# Patient Record
Sex: Female | Born: 1980 | Race: White | Hispanic: No | Marital: Married | Smoking: Current every day smoker
Health system: Southern US, Community
[De-identification: ages and names within clinical notes are randomized; demographics above are authoritative.]

## PROBLEM LIST (undated history)

## (undated) DIAGNOSIS — M1990 Unspecified osteoarthritis, unspecified site: Secondary | ICD-10-CM

## (undated) DIAGNOSIS — E282 Polycystic ovarian syndrome: Secondary | ICD-10-CM

## (undated) DIAGNOSIS — I10 Essential (primary) hypertension: Secondary | ICD-10-CM

## (undated) DIAGNOSIS — E119 Type 2 diabetes mellitus without complications: Secondary | ICD-10-CM

## (undated) DIAGNOSIS — K219 Gastro-esophageal reflux disease without esophagitis: Secondary | ICD-10-CM

## (undated) HISTORY — DX: Type 2 diabetes mellitus without complications: E11.9

## (undated) HISTORY — PX: WISDOM TOOTH EXTRACTION: SHX21

## (undated) HISTORY — DX: Gastro-esophageal reflux disease without esophagitis: K21.9

## (undated) HISTORY — DX: Essential (primary) hypertension: I10

---

## 2005-02-12 ENCOUNTER — Emergency Department (HOSPITAL_COMMUNITY): Admission: EM | Admit: 2005-02-12 | Discharge: 2005-02-12 | Payer: Self-pay | Admitting: Emergency Medicine

## 2016-09-29 ENCOUNTER — Ambulatory Visit: Payer: Medicaid Other

## 2016-09-29 ENCOUNTER — Encounter: Payer: Self-pay | Admitting: Orthopaedic Surgery

## 2016-09-29 ENCOUNTER — Ambulatory Visit (INDEPENDENT_AMBULATORY_CARE_PROVIDER_SITE_OTHER): Payer: Medicaid Other | Admitting: Orthopaedic Surgery

## 2016-09-29 VITALS — BP 149/89 | HR 83 | Temp 98.2°F | Ht 63.0 in | Wt 197.0 lb

## 2016-09-29 DIAGNOSIS — M25551 Pain in right hip: Secondary | ICD-10-CM | POA: Diagnosis not present

## 2016-09-29 DIAGNOSIS — G8929 Other chronic pain: Secondary | ICD-10-CM | POA: Diagnosis not present

## 2016-09-29 DIAGNOSIS — M5441 Lumbago with sciatica, right side: Secondary | ICD-10-CM | POA: Diagnosis not present
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
## 2016-09-29 NOTE — Addendum Note (Signed)
Addended by: Lenis NoonSACKFIELD, Riniyah Speich L on: 09/29/2016 09:30 AM   Modules accepted: Orders

## 2016-09-29 NOTE — Patient Instructions (Signed)
If not pregnant, call and we can get x-rays of the lumbar spine and hip.

## 2016-10-13 ENCOUNTER — Ambulatory Visit: Payer: Self-pay | Admitting: Orthopaedic Surgery

## 2016-11-18 ENCOUNTER — Encounter: Payer: Self-pay | Admitting: Orthopaedic Surgery

## 2016-11-18 ENCOUNTER — Ambulatory Visit (INDEPENDENT_AMBULATORY_CARE_PROVIDER_SITE_OTHER): Payer: Medicaid Other

## 2016-11-18 ENCOUNTER — Ambulatory Visit (INDEPENDENT_AMBULATORY_CARE_PROVIDER_SITE_OTHER): Payer: Medicaid Other | Admitting: Orthopaedic Surgery

## 2016-11-18 VITALS — BP 147/90 | HR 89 | Temp 97.4°F | Ht 63.0 in | Wt 200.0 lb

## 2016-11-18 DIAGNOSIS — F17210 Nicotine dependence, cigarettes, uncomplicated: Secondary | ICD-10-CM

## 2016-11-18 DIAGNOSIS — G8929 Other chronic pain: Secondary | ICD-10-CM

## 2016-11-18 DIAGNOSIS — M5441 Lumbago with sciatica, right side: Secondary | ICD-10-CM

## 2016-11-18 NOTE — Patient Instructions (Signed)
Steps to Quit Smoking Smoking tobacco can be bad for your health. It can also affect almost every organ in your body. Smoking puts you and people around you at risk for many serious Jenaye Rickert-lasting (chronic) diseases. Quitting smoking is hard, but it is one of the best things that you can do for your health. It is never too late to quit. What are the benefits of quitting smoking? When you quit smoking, you lower your risk for getting serious diseases and conditions. They can include:  Lung cancer or lung disease.  Heart disease.  Stroke.  Heart attack.  Not being able to have children (infertility).  Weak bones (osteoporosis) and broken bones (fractures).  If you have coughing, wheezing, and shortness of breath, those symptoms may get better when you quit. You may also get sick less often. If you are pregnant, quitting smoking can help to lower your chances of having a baby of low birth weight. What can I do to help me quit smoking? Talk with your doctor about what can help you quit smoking. Some things you can do (strategies) include:  Quitting smoking totally, instead of slowly cutting back how much you smoke over a period of time.  Going to in-person counseling. You are more likely to quit if you go to many counseling sessions.  Using resources and support systems, such as: ? Online chats with a counselor. ? Phone quitlines. ? Printed self-help materials. ? Support groups or group counseling. ? Text messaging programs. ? Mobile phone apps or applications.  Taking medicines. Some of these medicines may have nicotine in them. If you are pregnant or breastfeeding, do not take any medicines to quit smoking unless your doctor says it is okay. Talk with your doctor about counseling or other things that can help you.  Talk with your doctor about using more than one strategy at the same time, such as taking medicines while you are also going to in-person counseling. This can help make  quitting easier. What things can I do to make it easier to quit? Quitting smoking might feel very hard at first, but there is a lot that you can do to make it easier. Take these steps:  Talk to your family and friends. Ask them to support and encourage you.  Call phone quitlines, reach out to support groups, or work with a counselor.  Ask people who smoke to not smoke around you.  Avoid places that make you want (trigger) to smoke, such as: ? Bars. ? Parties. ? Smoke-break areas at work.  Spend time with people who do not smoke.  Lower the stress in your life. Stress can make you want to smoke. Try these things to help your stress: ? Getting regular exercise. ? Deep-breathing exercises. ? Yoga. ? Meditating. ? Doing a body scan. To do this, close your eyes, focus on one area of your body at a time from head to toe, and notice which parts of your body are tense. Try to relax the muscles in those areas.  Download or buy apps on your mobile phone or tablet that can help you stick to your quit plan. There are many free apps, such as QuitGuide from the CDC (Centers for Disease Control and Prevention). You can find more support from smokefree.gov and other websites.  This information is not intended to replace advice given to you by your health care provider. Make sure you discuss any questions you have with your health care provider. Document Released: 02/20/2009 Document   Revised: 12/23/2015 Document Reviewed: 09/10/2014 Elsevier Interactive Patient Education  2018 Elsevier Inc.  

## 2016-11-18 NOTE — Progress Notes (Signed)
Patient WU:[id number]:Jennifer Cordova, female DOB:[birth year], 36 y.o. GNF:[id number]  Chief Complaint  Patient presents with  . Follow-up    Low back and right hip pain    HPI  Jennifer DuffStephanie Cordova is a 36 y.o. female who has lower back pain.  She has good and bad days.  She has right sided paresthesias at time. She has no new trauma.  She did not get x-rays last time because she thought she could be pregnant but was not.  She has no weakness or bowel or bladder problems.  I want her to go to PT for the one time visit Medicaid allows and learn exercises to do. HPI  Body mass index is 35.43 kg/m.  ROS  Review of Systems  HENT: Negative for congestion.   Respiratory: Negative for cough and shortness of breath.   Cardiovascular: Negative for chest pain and leg swelling.  Endocrine: Positive for cold intolerance.  Musculoskeletal: Positive for arthralgias, back pain and gait problem.  Allergic/Immunologic: Positive for environmental allergies.  Psychiatric/Behavioral: The patient is nervous/anxious.     Past Medical History:  Diagnosis Date  . Diabetes mellitus without complication (HCC)   . GERD (gastroesophageal reflux disease)   . Hypertension     Past Surgical History:  Procedure Laterality Date  . WISDOM TOOTH EXTRACTION      Family History  Problem Relation Age of Onset  . Epilepsy Mother   . Stroke Mother   . Hypertension Mother   . COPD Sister   . Bipolar disorder Sister     Social History Social History  Substance Use Topics  . Smoking status: Never Smoker  . Smokeless tobacco: Never Used  . Alcohol use No    Allergies  Allergen Reactions  . Penicillins     Current Outpatient Prescriptions  Medication Sig Dispense Refill  . acetaminophen-codeine (TYLENOL #3) 300-30 MG tablet Take by mouth every 6 (six) hours as needed for moderate pain.    . AZO-CRANBERRY PO Take by mouth as needed.    . DiphenhydrAMINE HCl, Sleep, (SLEEP AID) 50 MG CAPS Take by mouth at  bedtime as needed.    . famotidine (PEPCID) 20 MG tablet Take 20 mg by mouth 2 (two) times daily.    Chilton Si. Green Tea, Camillia sinensis, (GREEN TEA PO) Take by mouth.    . hydrochlorothiazide (HYDRODIURIL) 25 MG tablet Take 25 mg by mouth daily.    Marland Kitchen. ibuprofen (ADVIL,MOTRIN) 800 MG tablet Take 800 mg by mouth 3 (three) times daily as needed.    Marland Kitchen. Lysine 1000 MG TABS Take by mouth.    . Omega-3 Fatty Acids (FISH OIL) 1000 MG CAPS Take by mouth.    Marland Kitchen. omeprazole (PRILOSEC) 20 MG capsule Take 20 mg by mouth daily.    . Prenatal Vit-Fe Fumarate-FA (PRENATAL COMPLETE PO) Take by mouth.    . Probiotic Product (PROBIOTIC PO) Take by mouth.    . simvastatin (ZOCOR) 20 MG tablet Take 20 mg by mouth daily.    Marland Kitchen. tiZANidine (ZANAFLEX) 4 MG tablet Take 4 mg by mouth 3 (three) times daily as needed for muscle spasms.     No current facility-administered medications for this visit.      Physical Exam  Blood pressure (!) 147/90, pulse 89, temperature (!) 97.4 F (36.3 C), height 5\' 3"  (1.6 m), weight 200 lb (90.7 kg), last menstrual period 11/04/2016.  Constitutional: overall normal hygiene, normal nutrition, well developed, normal grooming, normal body habitus. Assistive device:none  Musculoskeletal: gait and station Limp  none, muscle tone and strength are normal, no tremors or atrophy is present.  .  Neurological: coordination overall normal.  Deep tendon reflex/nerve stretch intact.  Sensation normal.  Cranial nerves II-XII intact.   Skin:   Normal overall no scars, lesions, ulcers or rashes. No psoriasis.  Psychiatric: Alert and oriented x 3.  Recent memory intact, remote memory unclear.  Normal mood and affect. Well groomed.  Good eye contact.  Cardiovascular: overall no swelling, no varicosities, no edema bilaterally, normal temperatures of the legs and arms, no clubbing, cyanosis and good capillary refill.  Lymphatic: palpation is normal.  Spine/Pelvis examination:  Inspection:  Overall,  sacoiliac joint benign and hips nontender; without crepitus or defects.   Thoracic spine inspection: Alignment normal without kyphosis present   Lumbar spine inspection:  Alignment  with normal lumbar lordosis, without scoliosis apparent.   Thoracic spine palpation:  with tenderness of spinal processes   Lumbar spine palpation: without tenderness of lumbar area; without tightness of lumbar muscles    Range of Motion:   Lumbar flexion, forward flexion is 45 without pain or tenderness    Lumbar extension is 10 without pain or tenderness   Left lateral bend is Normal  without pain or tenderness   Right lateral bend is Normal without pain or tenderness   Straight leg raising is Normal   Strength & tone: Normal   Stability overall normal stability     The patient has been educated about the nature of the problem(s) and counseled on treatment options.  The patient appeared to understand what I have discussed and is in agreement with it.  Encounter Diagnoses  Name Primary?  . Chronic right-sided low back pain with right-sided sciatica Yes  . Cigarette nicotine dependence without complication    She is trying to stop smoking.  PLAN Call if any problems.  Precautions discussed.  Continue current medications.   Return to clinic 3 months   Go to PT.  Electronically Signed Darreld Mclean, MD 7/12/20183:54 PM

## 2016-12-02 ENCOUNTER — Ambulatory Visit (HOSPITAL_COMMUNITY): Payer: Medicaid Other | Attending: Orthopaedic Surgery | Admitting: Physical Therapy

## 2016-12-02 DIAGNOSIS — R252 Cramp and spasm: Secondary | ICD-10-CM | POA: Diagnosis present

## 2016-12-02 DIAGNOSIS — G8929 Other chronic pain: Secondary | ICD-10-CM | POA: Diagnosis present

## 2016-12-02 DIAGNOSIS — R293 Abnormal posture: Secondary | ICD-10-CM | POA: Insufficient documentation

## 2016-12-02 DIAGNOSIS — R29898 Other symptoms and signs involving the musculoskeletal system: Secondary | ICD-10-CM | POA: Diagnosis present

## 2016-12-02 DIAGNOSIS — M5441 Lumbago with sciatica, right side: Secondary | ICD-10-CM | POA: Diagnosis present
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
## 2017-02-17 ENCOUNTER — Ambulatory Visit: Payer: Medicaid Other | Admitting: Orthopaedic Surgery

## 2017-02-22 ENCOUNTER — Ambulatory Visit: Payer: Medicaid Other | Admitting: Orthopaedic Surgery

## 2017-02-24 ENCOUNTER — Ambulatory Visit (INDEPENDENT_AMBULATORY_CARE_PROVIDER_SITE_OTHER): Payer: Medicaid Other | Admitting: Orthopaedic Surgery

## 2017-02-24 ENCOUNTER — Encounter: Payer: Self-pay | Admitting: Orthopaedic Surgery

## 2017-02-24 VITALS — BP 135/83 | HR 93 | Ht 63.0 in | Wt 208.0 lb

## 2017-02-24 DIAGNOSIS — M5441 Lumbago with sciatica, right side: Secondary | ICD-10-CM | POA: Diagnosis not present

## 2017-02-24 DIAGNOSIS — F17210 Nicotine dependence, cigarettes, uncomplicated: Secondary | ICD-10-CM

## 2017-02-24 DIAGNOSIS — G8929 Other chronic pain: Secondary | ICD-10-CM

## 2017-02-24 NOTE — Progress Notes (Signed)
Patient Jennifer Cordova, female DOB:09-13-1980, 36 y.o. JWJ:[id number]  Chief Complaint  Patient presents with  . Back Pain    HPI  Jennifer Cordova is a 36 y.o. female who has chronic stable lower back pain with some right sided sciatica.  Her paresthesias are less.  She has been doing exercises for her back at home.  I have encouraged a walking program.  She has no new trauma, no weakness.  She is cutting back on her smoking.  She wants to get pregnant.  HPI  Body mass index is 36.85 kg/m.  ROS  Review of Systems  HENT: Negative for congestion.   Respiratory: Negative for cough and shortness of breath.   Cardiovascular: Negative for chest pain and leg swelling.  Endocrine: Positive for cold intolerance.  Musculoskeletal: Positive for arthralgias, back pain and gait problem.  Allergic/Immunologic: Positive for environmental allergies.  Psychiatric/Behavioral: The patient is nervous/anxious.     Past Medical History:  Diagnosis Date  . Diabetes mellitus without complication (HCC)   . GERD (gastroesophageal reflux disease)   . Hypertension     Past Surgical History:  Procedure Laterality Date  . WISDOM TOOTH EXTRACTION      Family History  Problem Relation Age of Onset  . Epilepsy Mother   . Stroke Mother   . Hypertension Mother   . COPD Sister   . Bipolar disorder Sister     Social History Social History  Substance Use Topics  . Smoking status: Never Smoker  . Smokeless tobacco: Never Used  . Alcohol use No    Allergies  Allergen Reactions  . Penicillins     Current Outpatient Prescriptions  Medication Sig Dispense Refill  . acetaminophen-codeine (TYLENOL #3) 300-30 MG tablet Take by mouth every 6 (six) hours as needed for moderate pain.    . AZO-CRANBERRY PO Take by mouth as needed.    Marland Kitchen co-enzyme Q-10 30 MG capsule Take 30 mg by mouth 3 (three) times daily.    . famotidine (PEPCID) 20 MG tablet Take 20 mg by mouth 2 (two) times daily.    Chilton Si Tea, Camillia sinensis, (GREEN TEA PO) Take by mouth.    . hydrochlorothiazide (HYDRODIURIL) 25 MG tablet Take 25 mg by mouth daily.    Marland Kitchen ibuprofen (ADVIL,MOTRIN) 800 MG tablet Take 800 mg by mouth 3 (three) times daily as needed.    Marland Kitchen Lysine 1000 MG TABS Take by mouth.    . Melatonin 5 MG TABS Take by mouth.    . Omega-3 Fatty Acids (FISH OIL) 1000 MG CAPS Take by mouth.    Marland Kitchen omeprazole (PRILOSEC) 20 MG capsule Take 20 mg by mouth daily.    . Prenatal Vit-Fe Fumarate-FA (PRENATAL COMPLETE PO) Take by mouth.    . Probiotic Product (PROBIOTIC PO) Take by mouth.    . simvastatin (ZOCOR) 20 MG tablet Take 20 mg by mouth daily.    Marland Kitchen tiZANidine (ZANAFLEX) 4 MG tablet Take 4 mg by mouth 3 (three) times daily as needed for muscle spasms.     No current facility-administered medications for this visit.      Physical Exam  Blood pressure 135/83, pulse 93, height 5\' 3"  (1.6 m), weight 208 lb (94.3 kg).  Constitutional: overall normal hygiene, normal nutrition, well developed, normal grooming, normal body habitus. Assistive device:none  Musculoskeletal: gait and station Limp none, muscle tone and strength are normal, no tremors or atrophy is present.  .  Neurological: coordination overall normal.  Deep tendon reflex/nerve  stretch intact.  Sensation normal.  Cranial nerves II-XII intact.   Skin:   Normal overall no scars, lesions, ulcers or rashes. No psoriasis.  Psychiatric: Alert and oriented x 3.  Recent memory intact, remote memory unclear.  Normal mood and affect. Well groomed.  Good eye contact.  Cardiovascular: overall no swelling, no varicosities, no edema bilaterally, normal temperatures of the legs and arms, no clubbing, cyanosis and good capillary refill.  Lymphatic: palpation is normal.  All other systems reviewed and are negative   Spine/Pelvis examination:  Inspection:  Overall, sacoiliac joint benign and hips nontender; without crepitus or defects.   Thoracic spine  inspection: Alignment normal without kyphosis present   Lumbar spine inspection:  Alignment  with normal lumbar lordosis, without scoliosis apparent.   Thoracic spine palpation:  without tenderness of spinal processes   Lumbar spine palpation: without tenderness of lumbar area; without tightness of lumbar muscles    Range of Motion:   Lumbar flexion, forward flexion is normal without pain or tenderness    Lumbar extension is full without pain or tenderness   Left lateral bend is normal without pain or tenderness   Right lateral bend is normal without pain or tenderness   Straight leg raising is normal  Strength & tone: normal   Stability overall normal stability The patient has been educated about the nature of the problem(s) and counseled on treatment options.  The patient appeared to understand what I have discussed and is in agreement with it.  Encounter Diagnoses  Name Primary?  . Chronic right-sided low back pain with right-sided sciatica Yes  . Cigarette nicotine dependence without complication     PLAN Call if any problems.  Precautions discussed.  Continue current medications.   Return to clinic prn    Electronically Signed Darreld McleanWayne Molly Savarino, MD 10/18/20189:24 AM

## 2019-06-30 ENCOUNTER — Other Ambulatory Visit: Payer: Self-pay

## 2019-06-30 ENCOUNTER — Encounter (HOSPITAL_COMMUNITY): Payer: Self-pay | Admitting: Emergency Medicine

## 2019-06-30 ENCOUNTER — Emergency Department (HOSPITAL_COMMUNITY)
Admission: EM | Admit: 2019-06-30 | Discharge: 2019-06-30 | Disposition: A | Discharge status: Home / Self Care | Payer: Medicaid Other | Attending: Emergency Medicine | Admitting: Emergency Medicine

## 2019-06-30 DIAGNOSIS — Z79899 Other long term (current) drug therapy: Secondary | ICD-10-CM | POA: Insufficient documentation

## 2019-06-30 DIAGNOSIS — R1011 Right upper quadrant pain: Secondary | ICD-10-CM | POA: Insufficient documentation

## 2019-06-30 DIAGNOSIS — E119 Type 2 diabetes mellitus without complications: Secondary | ICD-10-CM | POA: Insufficient documentation

## 2019-06-30 DIAGNOSIS — R1013 Epigastric pain: Secondary | ICD-10-CM | POA: Insufficient documentation

## 2019-06-30 DIAGNOSIS — I10 Essential (primary) hypertension: Secondary | ICD-10-CM | POA: Insufficient documentation

## 2019-06-30 DIAGNOSIS — Z7984 Long term (current) use of oral hypoglycemic drugs: Secondary | ICD-10-CM | POA: Insufficient documentation

## 2019-06-30 DIAGNOSIS — F17210 Nicotine dependence, cigarettes, uncomplicated: Secondary | ICD-10-CM | POA: Insufficient documentation

## 2019-06-30 HISTORY — DX: Polycystic ovarian syndrome: E28.2

## 2019-06-30 LAB — CBC
HCT: 46.4 % — ABNORMAL HIGH (ref 36.0–46.0)
Hemoglobin: 15.3 g/dL — ABNORMAL HIGH (ref 12.0–15.0)
MCH: 30.7 pg (ref 26.0–34.0)
MCHC: 33 g/dL (ref 30.0–36.0)
MCV: 93 fL (ref 80.0–100.0)
Platelets: 341 10*3/uL (ref 150–400)
RBC: 4.99 MIL/uL (ref 3.87–5.11)
RDW: 12.7 % (ref 11.5–15.5)
WBC: 11.6 10*3/uL — ABNORMAL HIGH (ref 4.0–10.5)
nRBC: 0 % (ref 0.0–0.2)

## 2019-06-30 LAB — URINALYSIS, ROUTINE W REFLEX MICROSCOPIC
Bilirubin Urine: NEGATIVE
Glucose, UA: NEGATIVE mg/dL
Hgb urine dipstick: NEGATIVE
Ketones, ur: 5 mg/dL — AB
Leukocytes,Ua: NEGATIVE
Nitrite: NEGATIVE
Protein, ur: NEGATIVE mg/dL
Specific Gravity, Urine: 1.018 (ref 1.005–1.030)
pH: 5 (ref 5.0–8.0)

## 2019-06-30 LAB — COMPREHENSIVE METABOLIC PANEL
ALT: 33 U/L (ref 0–44)
AST: 21 U/L (ref 15–41)
Albumin: 4.6 g/dL (ref 3.5–5.0)
Alkaline Phosphatase: 66 U/L (ref 38–126)
Anion gap: 12 (ref 5–15)
BUN: 11 mg/dL (ref 6–20)
CO2: 23 mmol/L (ref 22–32)
Calcium: 9.2 mg/dL (ref 8.9–10.3)
Chloride: 100 mmol/L (ref 98–111)
Creatinine, Ser: 0.67 mg/dL (ref 0.44–1.00)
GFR calc Af Amer: >60 mL/min (ref >60)
GFR calc non Af Amer: >60 mL/min (ref >60)
Glucose, Bld: 133 mg/dL — ABNORMAL HIGH (ref 70–99)
Potassium: 3.3 mmol/L — ABNORMAL LOW (ref 3.5–5.1)
Sodium: 135 mmol/L (ref 135–145)
Total Bilirubin: 0.6 mg/dL (ref 0.3–1.2)
Total Protein: 7.8 g/dL (ref 6.5–8.1)

## 2019-06-30 LAB — POC URINE PREG, ED: Preg Test, Ur: NEGATIVE

## 2019-06-30 LAB — LIPASE, BLOOD: Lipase: 29 U/L (ref 11–51)

## 2019-06-30 MED ORDER — DICYCLOMINE HCL 10 MG PO CAPS
10.0000 mg | ORAL_CAPSULE | Freq: Once | ORAL | Status: AC
  Administered 2019-06-30: 17:00:00 10 mg via ORAL
  Filled 2019-06-30 (×2): qty 1

## 2019-06-30 MED ORDER — LIDOCAINE VISCOUS HCL 2 % MT SOLN
15.0000 mL | Freq: Once | OROMUCOSAL | Status: AC
  Administered 2019-06-30: 17:00:00 15 mL via ORAL
  Filled 2019-06-30: qty 15

## 2019-06-30 MED ORDER — ALUM & MAG HYDROXIDE-SIMETH 200-200-20 MG/5ML PO SUSP
30.0000 mL | Freq: Once | ORAL | Status: AC
  Administered 2019-06-30: 17:00:00 30 mL via ORAL
  Filled 2019-06-30: qty 30

## 2019-06-30 NOTE — ED Triage Notes (Signed)
Patient c/o mid upper abd pain that started this morning at 10 am and is constant. Per patient had similar episode 2 weeks ago but pain only lasted 2-3 hours. Denies any fevers, vomiting, urinary symptoms, or diarrhea. Patient states she does get nauseated with pain. Last BM this morning, no blood noted. Per patient "always loose stools."

## 2019-06-30 NOTE — ED Provider Notes (Signed)
[facility] EMERGENCY DEPARTMENT Provider Note   CSN: [id number] Arrival date & time: 06/30/19  1549     History Chief Complaint  Patient presents with  . Abdominal Pain    Jennifer Cordova is a 39 y.o. female.  HPI  Patient is a 39 year old female with a history of DM controlled by Metformin, GERD on omeprazole and famotidine, and hypertension presented today for epigastric abdominal pain that began at 10 AM this morning with gradual onset and has been constant since.  She describes the pain as sharp, worse with certain positions and 5/10 currently.  She states that it was somewhat worse prior to arrival with feels somewhat better currently.  She states that she took Pepto-Bismol as well as her omeprazole and famotidine today with no relief.  She states she had one episode of similar pain 2 weeks ago which resolved without intervention.  Patient states that she is concerned particularly because her mother died of a "dead gut after some sort of obstruction", patient states that her mother also had severe gastric ulcers and surgical removal of some of her stomach as result of this.  Patient states that she has had no nausea, vomiting, shortness of breath, chest pain, headache, dizziness.  She denies any changes in her bowel movements states that she experiences loose stools regularly but today was no different.  She had her last bowel movement this morning.  She denies any urinary symptoms, vaginal discharge, dyspareunia, vaginal irritation or itching.  Patient denies any hematochezia or melena.     Past Medical History:  Diagnosis Date  . Diabetes mellitus without complication ([place])   . GERD (gastroesophageal reflux disease)   . Hypertension   . PCOS (polycystic ovarian syndrome)     There are no problems to display for this patient.   Past Surgical History:  Procedure Laterality Date  . WISDOM TOOTH EXTRACTION       OB History    Gravida  1   Para      Term      Preterm      AB  1   Living        SAB  1   TAB      Ectopic      Multiple      Live Births              Family History  Problem Relation Age of Onset  . Epilepsy Mother   . Stroke Mother   . Hypertension Mother   . COPD Sister   . Bipolar disorder Sister     Social History   Tobacco Use  . Smoking status: Current Every Day Smoker    Packs/day: 1.00    Years: 20.00    Pack years: 20.00    Types: Cigarettes  . Smokeless tobacco: Never Used  Substance Use Topics  . Alcohol use: No  . Drug use: No    Home Medications Prior to Admission medications   Medication Sig Start Date End Date Taking? Authorizing Provider  acetaminophen-codeine (TYLENOL #3) 300-30 MG tablet Take by mouth every 6 (six) hours as needed for moderate pain.   Yes [provider]  atorvastatin (LIPITOR) 80 MG tablet Take 80 mg by mouth daily. 05/23/19  Yes [provider]  co-enzyme Q-10 30 MG capsule Take 30 mg by mouth 3 (three) times daily.   Yes [provider]  hydrochlorothiazide (HYDRODIURIL) 25 MG tablet Take 25 mg by mouth daily.   Yes [provider]  HYDROcodone-acetaminophen (NORCO) 10-325 MG tablet Take 1 tablet by mouth 4 (four) times daily as needed. 06/22/19  Yes [provider]  metFORMIN (GLUCOPHAGE) 500 MG tablet Take 500 mg by mouth 2 (two) times daily. 04/25/19  Yes [provider]  omeprazole (PRILOSEC) 40 MG capsule Take 40 mg by mouth daily. 04/25/19  Yes [provider]  tiZANidine (ZANAFLEX) 4 MG tablet Take 4 mg by mouth 3 (three) times daily as needed for muscle spasms.   Yes [provider]  zolpidem (AMBIEN) 10 MG tablet Take 10 mg by mouth at bedtime as needed. 06/21/19  Yes [provider]    Allergies    Penicillins  Review of Systems   Review of Systems  Constitutional: Negative for chills and fever.  HENT: Negative for congestion.   Eyes: Negative for pain.  Respiratory:  Negative for cough and shortness of breath.   Cardiovascular: Negative for chest pain and leg swelling.  Gastrointestinal: Positive for abdominal pain. Negative for vomiting.  Genitourinary: Negative for dysuria.  Musculoskeletal: Negative for myalgias.  Skin: Negative for rash.  Neurological: Negative for dizziness and headaches.    Physical Exam Updated Vital Signs BP 135/81   Pulse 88   Temp 98.6 F (37 C) (Oral)   Resp 18   Ht 5' (1.524 m)   Wt 89.8 kg   LMP 06/04/2019   SpO2 100%   BMI 38.67 kg/m   Physical Exam Vitals and nursing note reviewed.  Constitutional:      General: She is not in acute distress.    Appearance: She is obese.     Comments: Patient is a pleasant 39 year old female who appears uncomfortable.  She is holding pressure on her epigastric region.  Able to answer questions appropriately follow commands.  HENT:     Head: Normocephalic and atraumatic.     Nose: Nose normal.  Eyes:     General: No scleral icterus. Cardiovascular:     Rate and Rhythm: Normal rate and regular rhythm.     Pulses: Normal pulses.     Heart sounds: Normal heart sounds.     Comments: Heart rate is 94 on my exam Pulmonary:     Effort: Pulmonary effort is normal. No respiratory distress.     Breath sounds: No wheezing.  Abdominal:     Palpations: Abdomen is soft.     Tenderness: There is abdominal tenderness in the right upper quadrant and epigastric area.     Comments: Epigastric and right upper quadrant tenderness to palpation.  No rebound or guarding.  No lower abdominal or pelvic tenderness to palpation.  Musculoskeletal:     Cervical back: Normal range of motion.     Right lower leg: No edema.     Left lower leg: No edema.  Skin:    General: Skin is warm and dry.     Capillary Refill: Capillary refill takes less than 2 seconds.  Neurological:     Mental Status: She is alert. Mental status is at baseline.  Psychiatric:        Mood and Affect: Mood normal.         Behavior: Behavior normal.     ED Results / Procedures / Treatments   Labs (all labs ordered are listed, but only abnormal results are displayed) Labs Reviewed  COMPREHENSIVE METABOLIC PANEL - Abnormal; Notable for the following components:      Result Value   Potassium 3.3 (*)    Glucose, Bld  133 (*)    All other components within normal limits  CBC - Abnormal; Notable for the following components:   WBC 11.6 (*)    Hemoglobin 15.3 (*)    HCT 46.4 (*)    All other components within normal limits  URINALYSIS, ROUTINE W REFLEX MICROSCOPIC - Abnormal; Notable for the following components:   APPearance HAZY (*)    Ketones, ur 5 (*)    All other components within normal limits  LIPASE, BLOOD  POC URINE PREG, ED    EKG EKG Interpretation  Date/Time:  Saturday June 30 2019 17:17:04 EST Ventricular Rate:  90 PR Interval:    QRS Duration: 97 QT Interval:  367 QTC Calculation: 449 R Axis:   60 Text Interpretation: Sinus rhythm Baseline wander in lead(s) II III aVF Confirmed by Donnetta Hutching ([id number]) on 06/30/2019 5:47:22 PM   Radiology No results found.  Procedures Procedures (including critical care time)  Medications Ordered in ED Medications  dicyclomine (BENTYL) capsule 10 mg (10 mg Oral Given 06/30/19 1710)  alum & mag hydroxide-simeth (MAALOX/MYLANTA) 200-200-20 MG/5ML suspension 30 mL (30 mLs Oral Given 06/30/19 1710)    And  lidocaine (XYLOCAINE) 2 % viscous mouth solution 15 mL (15 mLs Oral Given 06/30/19 1710)    ED Course  I have reviewed the triage vital signs and the nursing notes.  Pertinent labs & imaging results that were available during my care of the patient were reviewed by me and considered in my medical decision making (see chart for details).    MDM Rules/Calculators/A&P                      Patient is well-appearing 39 year old female who is overweight, fair skinned.  She is history of DM and hypertension but no other significant past medical  history.  Has severe right upper quadrant pain and epigastric pain since 10 AM this morning.  She states she feels better currently however states that it seems to come and go.  She does not eat anything today.  Seems to be worse with eating.  She states she had one episode of similar to this 2 weeks ago.  This resolved without any intervention.  The patient's history and physical exam was significant for upper quadrant and epigastric tenderness with negative Murphy sign I suspect the patient has biliary colic.  Her vitals are within normal limits she had some normal vitals in triage I repeated in her ED bed with absolute normal vitals.  She is afebrile and has negative Murphy sign doubt cholecystitis.  Her LFTs and lipase are within normal limits therefore I doubt pancreatitis or choledocholithiasis or cholangitis.  As we do not have ultrasound ability at this time.  I have recommended the patient return to ED with morning to have ultrasound done.  She is agreeable to this.  She states her pain is well controlled at this time.  She was given Bentyl, GI cocktail and p.o. fluids which she tolerated without vomiting however she states that it made her symptoms temporarily worse but the improved afterwards.  EKG obtained to rule out STEMI.  It was within normal limits no signs of ischemia.  I independently reviewed the EKG.  She has no chest pain or shortness of breath indicate ACS and I will not order troponins at this time.  She is PERC negative.  Patient return tomorrow morning for ultrasound.  She is given return precautions return immediately to ED if symptoms worsen.  She  will use Tylenol and ibuprofen for pain today.   Final Clinical Impression(s) / ED Diagnoses Final diagnoses:  Epigastric pain  RUQ pain    Rx / DC Orders ED Discharge Orders         Ordered    US Abdomen Limited RUQ/Gall Gladder     06/30/19 1841           Gailen Shelter, PA 06/30/19 Prentice Docker    Donnetta Hutching,  MD 07/01/19 2129

## 2019-06-30 NOTE — ED Notes (Signed)
Pt tolerated drinking 150cc water with no difficulty.

## 2019-06-30 NOTE — Discharge Instructions (Addendum)
Please schedule your ultrasound appointment and return to ED ileus 15 to 30 minutes early.  Please not eat any food 30 minutes to an hour prior to your ultrasound.  You may use tylenol and ibuprofen for pain tonight and tomorrow.  Please use Tylenol or ibuprofen for pain.  You may use 600 mg ibuprofen every 6 hours or 1000 mg of Tylenol every 6 hours.  You may choose to alternate between the 2.  This would be most effective.  Not to exceed 4 g of Tylenol within 24 hours.  Not to exceed 3200 mg ibuprofen 24 hours.

## 2019-07-09 ENCOUNTER — Other Ambulatory Visit: Payer: Self-pay

## 2019-07-09 ENCOUNTER — Ambulatory Visit (HOSPITAL_COMMUNITY)
Admission: RE | Admit: 2019-07-09 | Discharge: 2019-07-09 | Disposition: A | Discharge status: Home / Self Care | Payer: Self-pay | Source: Ambulatory Visit | Attending: Emergency Medicine | Admitting: Emergency Medicine

## 2019-07-09 DIAGNOSIS — R1011 Right upper quadrant pain: Secondary | ICD-10-CM | POA: Insufficient documentation

## 2019-07-09 NOTE — ED Provider Notes (Signed)
Ultrasound abdomen shows gallstones with cholecystitis.  Patient states she has had no pain nausea or vomiting in the last 10 days.  I spoke with general surgery Dr. Lovell Sheehan and since the patient states she would like to wait till May 1 to get her surgery done because that is when she gets insurance he stated that they would be glad to see the patient prior to that if necessary but if she wants to wait she just needs to stay away from fatty foods that aggravate it.  Patient was told to return if she gets worse   Bethann Berkshire, MD 07/09/19 1139

## 2019-08-07 ENCOUNTER — Other Ambulatory Visit: Payer: Self-pay

## 2019-08-07 ENCOUNTER — Encounter: Payer: Self-pay | Admitting: General Surgery

## 2019-08-07 ENCOUNTER — Encounter (INDEPENDENT_AMBULATORY_CARE_PROVIDER_SITE_OTHER): Payer: Self-pay

## 2019-08-07 ENCOUNTER — Ambulatory Visit (INDEPENDENT_AMBULATORY_CARE_PROVIDER_SITE_OTHER): Payer: Self-pay | Admitting: General Surgery

## 2019-08-07 VITALS — BP 131/80 | HR 110 | Temp 98.5°F | Resp 12 | Ht 60.0 in | Wt 198.0 lb

## 2019-08-07 DIAGNOSIS — K8020 Calculus of gallbladder without cholecystitis without obstruction: Secondary | ICD-10-CM

## 2019-08-07 NOTE — Patient Instructions (Signed)
Laparoscopic Cholecystectomy Laparoscopic cholecystectomy is surgery to remove the gallbladder. The gallbladder is a pear-shaped organ that lies beneath the liver on the right side of the body. The gallbladder stores bile, which is a fluid that helps the body to digest fats. Cholecystectomy is often done for inflammation of the gallbladder (cholecystitis). This condition is usually caused by a buildup of gallstones (cholelithiasis) in the gallbladder. Gallstones can block the flow of bile, which can result in inflammation and pain. In severe cases, emergency surgery may be required. This procedure is done though small incisions in your abdomen (laparoscopic surgery). A thin scope with a camera (laparoscope) is inserted through one incision. Thin surgical instruments are inserted through the other incisions. In some cases, a laparoscopic procedure may be turned into a type of surgery that is done through a larger incision (open surgery). Tell a health care provider about:  Any allergies you have.  All medicines you are taking, including vitamins, herbs, eye drops, creams, and over-the-counter medicines.  Any problems you or family members have had with anesthetic medicines.  Any blood disorders you have.  Any surgeries you have had.  Any medical conditions you have.  Whether you are pregnant or may be pregnant. What are the risks? Generally, this is a safe procedure. However, problems may occur, including:  Infection.  Bleeding.  Allergic reactions to medicines.  Damage to other structures or organs.  A stone remaining in the common bile duct. The common bile duct carries bile from the gallbladder into the small intestine.  A bile leak from the cyst duct that is clipped when your gallbladder is removed. What happens before the procedure?   Medicines  Ask your health care provider about: ? Changing or stopping your regular medicines. This is especially important if you are taking  diabetes medicines or blood thinners. ? Taking medicines such as aspirin and ibuprofen. These medicines can thin your blood. Do not take these medicines before your procedure if your health care provider instructs you not to.  You may be given antibiotic medicine to help prevent infection. General instructions  Let your health care provider know if you develop a cold or an infection before surgery.  Plan to have someone take you home from the hospital or clinic.  Ask your health care provider how your surgical site will be marked or identified. What happens during the procedure?   To reduce your risk of infection: ? Your health care team will wash or sanitize their hands. ? Your skin will be washed with soap. ? Hair may be removed from the surgical area.  An IV tube may be inserted into one of your veins.  You will be given one or more of the following: ? A medicine to help you relax (sedative). ? A medicine to make you fall asleep (general anesthetic).  A breathing tube will be placed in your mouth.  Your surgeon will make several small cuts (incisions) in your abdomen.  The laparoscope will be inserted through one of the small incisions. The camera on the laparoscope will send images to a TV screen (monitor) in the operating room. This lets your surgeon see inside your abdomen.  Air-like gas will be pumped into your abdomen. This will expand your abdomen to give the surgeon more room to perform the surgery.  Other tools that are needed for the procedure will be inserted through the other incisions. The gallbladder will be removed through one of the incisions.  Your common bile duct   may be examined. If stones are found in the common bile duct, they may be removed.  After your gallbladder has been removed, the incisions will be closed with stitches (sutures), staples, or skin glue.  Your incisions may be covered with a bandage (dressing). The procedure may vary among health  care providers and hospitals. What happens after the procedure?  Your blood pressure, heart rate, breathing rate, and blood oxygen level will be monitored until the medicines you were given have worn off.  You will be given medicines as needed to control your pain.  Do not drive for 24 hours if you were given a sedative. This information is not intended to replace advice given to you by your health care provider. Make sure you discuss any questions you have with your health care provider. Document Revised: 04/08/2017 Document Reviewed: 10/13/2015 Elsevier Patient Education  2020 [employer]  

## 2019-08-08 NOTE — H&P (Signed)
Jennifer Cordova; [id number]; 20-Sep-1980   HPI Patient is a 39 year old white female who was referred to my care by Cheron Every for evaluation treatment of biliary colic secondary to cholelithiasis.  Patient has had multiple episodes of biliary colic over the past few weeks.  These seem to be increase in intensity and frequency.  It is made worse with some fatty foods.  She does have nausea and bloating.  The pain is in the right upper quadrant and radiates around the right flank.  It does resolve on its own.  Occasional emesis is noted.  No fever, chills, jaundice have been noted.  She currently has 0 out of 10 abdominal pain. Past Medical History:  Diagnosis Date  . Diabetes mellitus without complication (HCC)   . GERD (gastroesophageal reflux disease)   . Hypertension   . PCOS (polycystic ovarian syndrome)     Past Surgical History:  Procedure Laterality Date  . WISDOM TOOTH EXTRACTION      Family History  Problem Relation Age of Onset  . Epilepsy Mother   . Stroke Mother   . Hypertension Mother   . COPD Sister   . Bipolar disorder Sister     Current Outpatient Medications on File Prior to Visit  Medication Sig Dispense Refill  . acetaminophen-codeine (TYLENOL #3) 300-30 MG tablet Take by mouth every 6 (six) hours as needed for moderate pain.    Marland Kitchen atorvastatin (LIPITOR) 80 MG tablet Take 80 mg by mouth daily.    . calcium-vitamin D (OSCAL WITH D) 500-200 MG-UNIT tablet Take 1 tablet by mouth.    . co-enzyme Q-10 30 MG capsule Take 30 mg by mouth 3 (three) times daily.    . hydrochlorothiazide (HYDRODIURIL) 25 MG tablet Take 25 mg by mouth daily.    Marland Kitchen HYDROcodone-acetaminophen (NORCO) 10-325 MG tablet Take 1 tablet by mouth 4 (four) times daily as needed.    . metFORMIN (GLUCOPHAGE) 500 MG tablet Take 500 mg by mouth 2 (two) times daily.    Marland Kitchen omeprazole (PRILOSEC) 40 MG capsule Take 40 mg by mouth daily.    . Probiotic Product (ALOE 24401 & PROBIOTICS PO) Take by mouth.     Marland Kitchen tiZANidine (ZANAFLEX) 4 MG tablet Take 4 mg by mouth 3 (three) times daily as needed for muscle spasms.    Marland Kitchen zolpidem (AMBIEN) 10 MG tablet Take 10 mg by mouth at bedtime as needed.     No current facility-administered medications on file prior to visit.    Allergies  Allergen Reactions  . Penicillins     Social History   Substance and Sexual Activity  Alcohol Use No    Social History   Tobacco Use  Smoking Status Current Every Day Smoker  . Packs/day: 1.00  . Years: 20.00  . Pack years: 20.00  . Types: Cigarettes  Smokeless Tobacco Never Used    Review of Systems  Constitutional: Positive for malaise/fatigue.  HENT: Negative.   Eyes: Negative.   Respiratory: Negative.   Cardiovascular: Negative.   Gastrointestinal: Positive for heartburn.  Genitourinary: Negative.   Musculoskeletal: Positive for back pain and joint pain.  Skin: Negative.   Neurological: Negative.   Endo/Heme/Allergies: Negative.   Psychiatric/Behavioral: Negative.     Objective   Vitals:   08/07/19 0911  BP: 131/80  Pulse: (!) 110  Resp: 12  Temp: 98.5 F (36.9 C)  SpO2: 93%    Physical Exam Vitals reviewed.  Constitutional:      Appearance: Normal appearance. She is  obese. She is not ill-appearing.  HENT:     Head: Normocephalic and atraumatic.  Eyes:     General: No scleral icterus. Cardiovascular:     Rate and Rhythm: Normal rate and regular rhythm.     Heart sounds: Normal heart sounds. No murmur. No friction rub. No gallop.   Pulmonary:     Effort: Pulmonary effort is normal. No respiratory distress.     Breath sounds: Normal breath sounds. No stridor. No wheezing, rhonchi or rales.  Abdominal:     General: Bowel sounds are normal. There is no distension.     Palpations: Abdomen is soft. There is no mass.     Tenderness: There is abdominal tenderness. There is no guarding or rebound.     Hernia: No hernia is present.  Skin:    General: Skin is warm and dry.   Neurological:     Mental Status: She is alert and oriented to person, place, and time.   ER notes reviewed. Ultrasound shows cholelithiasis with a normal common bile duct.  Assessment  Biliary colic, cholelithiasis Plan   Patient is scheduled for laparoscopic cholecystectomy on 08/20/2019.  The risks and benefits of the procedure including bleeding, infection, hepatobiliary injury, and the possibility of an open procedure were fully explained to the patient, who gave informed consent. 

## 2019-08-08 NOTE — Progress Notes (Signed)
Jennifer Cordova; [id number]; 20-Sep-1980   HPI Patient is a 39 year old white female who was referred to my care by Cheron Every for evaluation treatment of biliary colic secondary to cholelithiasis.  Patient has had multiple episodes of biliary colic over the past few weeks.  These seem to be increase in intensity and frequency.  It is made worse with some fatty foods.  She does have nausea and bloating.  The pain is in the right upper quadrant and radiates around the right flank.  It does resolve on its own.  Occasional emesis is noted.  No fever, chills, jaundice have been noted.  She currently has 0 out of 10 abdominal pain. Past Medical History:  Diagnosis Date  . Diabetes mellitus without complication (HCC)   . GERD (gastroesophageal reflux disease)   . Hypertension   . PCOS (polycystic ovarian syndrome)     Past Surgical History:  Procedure Laterality Date  . WISDOM TOOTH EXTRACTION      Family History  Problem Relation Age of Onset  . Epilepsy Mother   . Stroke Mother   . Hypertension Mother   . COPD Sister   . Bipolar disorder Sister     Current Outpatient Medications on File Prior to Visit  Medication Sig Dispense Refill  . acetaminophen-codeine (TYLENOL #3) 300-30 MG tablet Take by mouth every 6 (six) hours as needed for moderate pain.    Marland Kitchen atorvastatin (LIPITOR) 80 MG tablet Take 80 mg by mouth daily.    . calcium-vitamin D (OSCAL WITH D) 500-200 MG-UNIT tablet Take 1 tablet by mouth.    . co-enzyme Q-10 30 MG capsule Take 30 mg by mouth 3 (three) times daily.    . hydrochlorothiazide (HYDRODIURIL) 25 MG tablet Take 25 mg by mouth daily.    Marland Kitchen HYDROcodone-acetaminophen (NORCO) 10-325 MG tablet Take 1 tablet by mouth 4 (four) times daily as needed.    . metFORMIN (GLUCOPHAGE) 500 MG tablet Take 500 mg by mouth 2 (two) times daily.    Marland Kitchen omeprazole (PRILOSEC) 40 MG capsule Take 40 mg by mouth daily.    . Probiotic Product (ALOE 24401 & PROBIOTICS PO) Take by mouth.     Marland Kitchen tiZANidine (ZANAFLEX) 4 MG tablet Take 4 mg by mouth 3 (three) times daily as needed for muscle spasms.    Marland Kitchen zolpidem (AMBIEN) 10 MG tablet Take 10 mg by mouth at bedtime as needed.     No current facility-administered medications on file prior to visit.    Allergies  Allergen Reactions  . Penicillins     Social History   Substance and Sexual Activity  Alcohol Use No    Social History   Tobacco Use  Smoking Status Current Every Day Smoker  . Packs/day: 1.00  . Years: 20.00  . Pack years: 20.00  . Types: Cigarettes  Smokeless Tobacco Never Used    Review of Systems  Constitutional: Positive for malaise/fatigue.  HENT: Negative.   Eyes: Negative.   Respiratory: Negative.   Cardiovascular: Negative.   Gastrointestinal: Positive for heartburn.  Genitourinary: Negative.   Musculoskeletal: Positive for back pain and joint pain.  Skin: Negative.   Neurological: Negative.   Endo/Heme/Allergies: Negative.   Psychiatric/Behavioral: Negative.     Objective   Vitals:   08/07/19 0911  BP: 131/80  Pulse: (!) 110  Resp: 12  Temp: 98.5 F (36.9 C)  SpO2: 93%    Physical Exam Vitals reviewed.  Constitutional:      Appearance: Normal appearance. She is  obese. She is not ill-appearing.  HENT:     Head: Normocephalic and atraumatic.  Eyes:     General: No scleral icterus. Cardiovascular:     Rate and Rhythm: Normal rate and regular rhythm.     Heart sounds: Normal heart sounds. No murmur. No friction rub. No gallop.   Pulmonary:     Effort: Pulmonary effort is normal. No respiratory distress.     Breath sounds: Normal breath sounds. No stridor. No wheezing, rhonchi or rales.  Abdominal:     General: Bowel sounds are normal. There is no distension.     Palpations: Abdomen is soft. There is no mass.     Tenderness: There is abdominal tenderness. There is no guarding or rebound.     Hernia: No hernia is present.  Skin:    General: Skin is warm and dry.   Neurological:     Mental Status: She is alert and oriented to person, place, and time.   ER notes reviewed. Ultrasound shows cholelithiasis with a normal common bile duct.  Assessment  Biliary colic, cholelithiasis Plan   Patient is scheduled for laparoscopic cholecystectomy on 08/20/2019.  The risks and benefits of the procedure including bleeding, infection, hepatobiliary injury, and the possibility of an open procedure were fully explained to the patient, who gave informed consent.

## 2019-08-15 NOTE — Patient Instructions (Signed)
Jennifer Cordova  08/15/2019     @PREFPERIOPPHARMACY @   Your procedure is scheduled on  08/20/2019 .  Report to [facility] at  [place].  Call this number if you have problems the morning of surgery:  [phone]   Remember:  Do not eat or drink after midnight.                       Take these medicines the morning of surgery with A SIP OF WATER  Pepcid, omeprazxole, hydrocodone(if needed), zanaflex(if needed). DO NOT take any medication for diabetes the morning of your procedure.    Do not wear jewelry, make-up or nail polish.  Do not wear lotions, powders, or perfumes. Please wear deodorant and brush your teeth.  Do not shave 48 hours prior to surgery.  Men may shave face and neck.  Do not bring valuables to the hospital.  [facility] is not responsible for any belongings or valuables.  Contacts, dentures or bridgework may not be worn into surgery.  Leave your suitcase in the car.  After surgery it may be brought to your room.  For patients admitted to the hospital, discharge time will be determined by your treatment team.  Patients discharged the day of surgery will not be allowed to drive home.   Name and phone number of your driver:   family Special instructions:  DO NOT smoke the morning of your procedure.  Please read over the following fact sheets that you were given. Anesthesia Post-op Instructions and Care and Recovery After Surgery       Laparoscopic Cholecystectomy, Care After This sheet gives you information about how to care for yourself after your procedure. Your health care provider may also give you more specific instructions. If you have problems or questions, contact your health care provider. What can I expect after the procedure? After the procedure, it is common to have:  Pain at your incision sites. You will be given medicines to control this pain.  Mild nausea or vomiting.  Bloating and possible shoulder pain from the air-like  gas that was used during the procedure. Follow these instructions at home: Incision care   Follow instructions from your health care provider about how to take care of your incisions. Make sure you: ? Wash your hands with soap and water before you change your bandage (dressing). If soap and water are not available, use hand sanitizer. ? Change your dressing as told by your health care provider. ? Leave stitches (sutures), skin glue, or adhesive strips in place. These skin closures may need to be in place for 2 weeks or longer. If adhesive strip edges start to loosen and curl up, you may trim the loose edges. Do not remove adhesive strips completely unless your health care provider tells you to do that.  Do not take baths, swim, or use a hot tub until your health care provider approves. Ask your health care provider if you can take showers. You may only be allowed to take sponge baths for bathing.  Check your incision area every day for signs of infection. Check for: ? More redness, swelling, or pain. ? More fluid or blood. ? Warmth. ? Pus or a bad smell. Activity  Do not drive or use heavy machinery while taking prescription pain medicine.  Do not lift anything that is heavier than 10 lb (4.5 kg) until your health care provider approves.  Do not play contact sports until your health care provider approves.  Do not drive for 24 hours if you were given a medicine to help you relax (sedative).  Rest as needed. Do not return to work or school until your health care provider approves. General instructions  Take over-the-counter and prescription medicines only as told by your health care provider.  To prevent or treat constipation while you are taking prescription pain medicine, your health care provider may recommend that you: ? Drink enough fluid to keep your urine clear or pale yellow. ? Take over-the-counter or prescription medicines. ? Eat foods that are high in fiber, such as fresh  fruits and vegetables, whole grains, and beans. ? Limit foods that are high in fat and processed sugars, such as fried and sweet foods. Contact a health care provider if:  You develop a rash.  You have more redness, swelling, or pain around your incisions.  You have more fluid or blood coming from your incisions.  Your incisions feel warm to the touch.  You have pus or a bad smell coming from your incisions.  You have a fever.  One or more of your incisions breaks open. Get help right away if:  You have trouble breathing.  You have chest pain.  You have increasing pain in your shoulders.  You faint or feel dizzy when you stand.  You have severe pain in your abdomen.  You have nausea or vomiting that lasts for more than one day.  You have leg pain. This information is not intended to replace advice given to you by your health care provider. Make sure you discuss any questions you have with your health care provider. Document Revised: 04/08/2017 Document Reviewed: 10/13/2015 Elsevier Patient Education  2020 [employer]  General Anesthesia, Adult, Care After This sheet gives you information about how to care for yourself after your procedure. Your health care provider may also give you more specific instructions. If you have problems or questions, contact your health care provider. What can I expect after the procedure? After the procedure, the following side effects are common:  Pain or discomfort at the IV site.  Nausea.  Vomiting.  Sore throat.  Trouble concentrating.  Feeling cold or chills.  Weak or tired.  Sleepiness and fatigue.  Soreness and body aches. These side effects can affect parts of the body that were not involved in surgery. Follow these instructions at home:  For at least 24 hours after the procedure:  Have a responsible adult stay with you. It is important to have someone help care for you until you are awake and alert.  Rest as  needed.  Do not: ? Participate in activities in which you could fall or become injured. ? Drive. ? Use heavy machinery. ? Drink alcohol. ? Take sleeping pills or medicines that cause drowsiness. ? Make important decisions or sign legal documents. ? Take care of children on your own. Eating and drinking  Follow any instructions from your health care provider about eating or drinking restrictions.  When you feel hungry, start by eating small amounts of foods that are soft and easy to digest (bland), such as toast. Gradually return to your regular diet.  Drink enough fluid to keep your urine pale yellow.  If you vomit, rehydrate by drinking water, juice, or clear broth. General instructions  If you have sleep apnea, surgery and certain medicines can increase your risk for breathing problems. Follow instructions from your health care provider about  wearing your sleep device: ? Anytime you are sleeping, including during daytime naps. ? While taking prescription pain medicines, sleeping medicines, or medicines that make you drowsy.  Return to your normal activities as told by your health care provider. Ask your health care provider what activities are safe for you.  Take over-the-counter and prescription medicines only as told by your health care provider.  If you smoke, do not smoke without supervision.  Keep all follow-up visits as told by your health care provider. This is important. Contact a health care provider if:  You have nausea or vomiting that does not get better with medicine.  You cannot eat or drink without vomiting.  You have pain that does not get better with medicine.  You are unable to pass urine.  You develop a skin rash.  You have a fever.  You have redness around your IV site that gets worse. Get help right away if:  You have difficulty breathing.  You have chest pain.  You have blood in your urine or stool, or you vomit blood. Summary  After the  procedure, it is common to have a sore throat or nausea. It is also common to feel tired.  Have a responsible adult stay with you for the first 24 hours after general anesthesia. It is important to have someone help care for you until you are awake and alert.  When you feel hungry, start by eating small amounts of foods that are soft and easy to digest (bland), such as toast. Gradually return to your regular diet.  Drink enough fluid to keep your urine pale yellow.  Return to your normal activities as told by your health care provider. Ask your health care provider what activities are safe for you. This information is not intended to replace advice given to you by your health care provider. Make sure you discuss any questions you have with your health care provider. Document Revised: 04/29/2017 Document Reviewed: 12/10/2016 Elsevier Patient Education  [place]. How to Use Chlorhexidine for Bathing Chlorhexidine gluconate (CHG) is a germ-killing (antiseptic) solution that is used to clean the skin. It can get rid of the bacteria that normally live on the skin and can keep them away for about 24 hours. To clean your skin with CHG, you may be given:  A CHG solution to use in the shower or as part of a sponge bath.  A prepackaged cloth that contains CHG. Cleaning your skin with CHG may help lower the risk for infection:  While you are staying in the intensive care unit of the hospital.  If you have a vascular access, such as a central line, to provide short-term or long-term access to your veins.  If you have a catheter to drain urine from your bladder.  If you are on a ventilator. A ventilator is a machine that helps you breathe by moving air in and out of your lungs.  After surgery. What are the risks? Risks of using CHG include:  A skin reaction.  Hearing loss, if CHG gets in your ears.  Eye injury, if CHG gets in your eyes and is not rinsed out.  The CHG product  catching fire. Make sure that you avoid smoking and flames after applying CHG to your skin. Do not use CHG:  If you have a chlorhexidine allergy or have previously reacted to chlorhexidine.  On babies younger than 55 months of age. How to use CHG solution  Use CHG only as told  by your health care provider, and follow the instructions on the label.  Use the full amount of CHG as directed. Usually, this is one bottle. During a shower Follow these steps when using CHG solution during a shower (unless your health care provider gives you different instructions): 1. Start the shower. 2. Use your normal soap and shampoo to wash your face and hair. 3. Turn off the shower or move out of the shower stream. 4. Pour the CHG onto a clean washcloth. Do not use any type of brush or rough-edged sponge. 5. Starting at your neck, lather your body down to your toes. Make sure you follow these instructions: ? If you will be having surgery, pay special attention to the part of your body where you will be having surgery. Scrub this area for at least 1 minute. ? Do not use CHG on your head or face. If the solution gets into your ears or eyes, rinse them well with water. ? Avoid your genital area. ? Avoid any areas of skin that have broken skin, cuts, or scrapes. ? Scrub your back and under your arms. Make sure to wash skin folds. 6. Let the lather sit on your skin for 1-2 minutes or as long as told by your health care provider. 7. Thoroughly rinse your entire body in the shower. Make sure that all body creases and crevices are rinsed well. 8. Dry off with a clean towel. Do not put any substances on your body afterward--such as powder, lotion, or perfume--unless you are told to do so by your health care provider. Only use lotions that are recommended by the manufacturer. 9. Put on clean clothes or pajamas. 10. If it is the night before your surgery, sleep in clean sheets.  During a sponge bath Follow these  steps when using CHG solution during a sponge bath (unless your health care provider gives you different instructions): 1. Use your normal soap and shampoo to wash your face and hair. 2. Pour the CHG onto a clean washcloth. 3. Starting at your neck, lather your body down to your toes. Make sure you follow these instructions: ? If you will be having surgery, pay special attention to the part of your body where you will be having surgery. Scrub this area for at least 1 minute. ? Do not use CHG on your head or face. If the solution gets into your ears or eyes, rinse them well with water. ? Avoid your genital area. ? Avoid any areas of skin that have broken skin, cuts, or scrapes. ? Scrub your back and under your arms. Make sure to wash skin folds. 4. Let the lather sit on your skin for 1-2 minutes or as long as told by your health care provider. 5. Using a different clean, wet washcloth, thoroughly rinse your entire body. Make sure that all body creases and crevices are rinsed well. 6. Dry off with a clean towel. Do not put any substances on your body afterward--such as powder, lotion, or perfume--unless you are told to do so by your health care provider. Only use lotions that are recommended by the manufacturer. 7. Put on clean clothes or pajamas. 8. If it is the night before your surgery, sleep in clean sheets. How to use CHG prepackaged cloths  Only use CHG cloths as told by your health care provider, and follow the instructions on the label.  Use the CHG cloth on clean, dry skin.  Do not use the CHG cloth on your  head or face unless your health care provider tells you to.  When washing with the CHG cloth: ? Avoid your genital area. ? Avoid any areas of skin that have broken skin, cuts, or scrapes. Before surgery Follow these steps when using a CHG cloth to clean before surgery (unless your health care provider gives you different instructions): 1. Using the CHG cloth, vigorously scrub the  part of your body where you will be having surgery. Scrub using a back-and-forth motion for 3 minutes. The area on your body should be completely wet with CHG when you are done scrubbing. 2. Do not rinse. Discard the cloth and let the area air-dry. Do not put any substances on the area afterward, such as powder, lotion, or perfume. 3. Put on clean clothes or pajamas. 4. If it is the night before your surgery, sleep in clean sheets.  For general bathing Follow these steps when using CHG cloths for general bathing (unless your health care provider gives you different instructions). 1. Use a separate CHG cloth for each area of your body. Make sure you wash between any folds of skin and between your fingers and toes. Wash your body in the following order, switching to a new cloth after each step: ? The front of your neck, shoulders, and chest. ? Both of your arms, under your arms, and your hands. ? Your stomach and groin area, avoiding the genitals. ? Your right leg and foot. ? Your left leg and foot. ? The back of your neck, your back, and your buttocks. 2. Do not rinse. Discard the cloth and let the area air-dry. Do not put any substances on your body afterward--such as powder, lotion, or perfume--unless you are told to do so by your health care provider. Only use lotions that are recommended by the manufacturer. 3. Put on clean clothes or pajamas. Contact a health care provider if:  Your skin gets irritated after scrubbing.  You have questions about using your solution or cloth. Get help right away if:  Your eyes become very red or swollen.  Your eyes itch badly.  Your skin itches badly and is red or swollen.  Your hearing changes.  You have trouble seeing.  You have swelling or tingling in your mouth or throat.  You have trouble breathing.  You swallow any chlorhexidine. Summary  Chlorhexidine gluconate (CHG) is a germ-killing (antiseptic) solution that is used to clean the  skin. Cleaning your skin with CHG may help to lower your risk for infection.  You may be given CHG to use for bathing. It may be in a bottle or in a prepackaged cloth to use on your skin. Carefully follow your health care provider's instructions and the instructions on the product label.  Do not use CHG if you have a chlorhexidine allergy.  Contact your health care provider if your skin gets irritated after scrubbing. This information is not intended to replace advice given to you by your health care provider. Make sure you discuss any questions you have with your health care provider. Document Revised: 07/13/2018 Document Reviewed: 03/24/2017 Elsevier Patient Education  [place].

## 2019-08-16 ENCOUNTER — Other Ambulatory Visit: Payer: Self-pay

## 2019-08-16 ENCOUNTER — Encounter (HOSPITAL_COMMUNITY): Payer: Self-pay

## 2019-08-16 ENCOUNTER — Encounter (HOSPITAL_COMMUNITY)
Admission: RE | Admit: 2019-08-16 | Discharge: 2019-08-16 | Disposition: A | Discharge status: Home / Self Care | Payer: Self-pay | Source: Ambulatory Visit | Attending: General Surgery | Admitting: General Surgery

## 2019-08-16 DIAGNOSIS — Z01812 Encounter for preprocedural laboratory examination: Secondary | ICD-10-CM | POA: Insufficient documentation

## 2019-08-16 HISTORY — DX: Unspecified osteoarthritis, unspecified site: M19.90

## 2019-08-16 LAB — BASIC METABOLIC PANEL
Anion gap: 14 (ref 5–15)
BUN: 12 mg/dL (ref 6–20)
CO2: 21 mmol/L — ABNORMAL LOW (ref 22–32)
Calcium: 9.7 mg/dL (ref 8.9–10.3)
Chloride: 102 mmol/L (ref 98–111)
Creatinine, Ser: 0.67 mg/dL (ref 0.44–1.00)
GFR calc Af Amer: >60 mL/min (ref >60)
GFR calc non Af Amer: >60 mL/min (ref >60)
Glucose, Bld: 178 mg/dL — ABNORMAL HIGH (ref 70–99)
Potassium: 3.1 mmol/L — ABNORMAL LOW (ref 3.5–5.1)
Sodium: 137 mmol/L (ref 135–145)

## 2019-08-16 LAB — CBC WITH DIFFERENTIAL/PLATELET
Abs Immature Granulocytes: 0.02 10*3/uL (ref 0.00–0.07)
Basophils Absolute: 0.1 10*3/uL (ref 0.0–0.1)
Basophils Relative: 1 %
Eosinophils Absolute: 0.1 10*3/uL (ref 0.0–0.5)
Eosinophils Relative: 1 %
HCT: 45.6 % (ref 36.0–46.0)
Hemoglobin: 15.1 g/dL — ABNORMAL HIGH (ref 12.0–15.0)
Immature Granulocytes: 0 %
Lymphocytes Relative: 30 %
Lymphs Abs: 3.3 10*3/uL (ref 0.7–4.0)
MCH: 30.7 pg (ref 26.0–34.0)
MCHC: 33.1 g/dL (ref 30.0–36.0)
MCV: 92.7 fL (ref 80.0–100.0)
Monocytes Absolute: 0.5 10*3/uL (ref 0.1–1.0)
Monocytes Relative: 5 %
Neutro Abs: 6.9 10*3/uL (ref 1.7–7.7)
Neutrophils Relative %: 63 %
Platelets: 304 10*3/uL (ref 150–400)
RBC: 4.92 MIL/uL (ref 3.87–5.11)
RDW: 12.5 % (ref 11.5–15.5)
WBC: 10.9 10*3/uL — ABNORMAL HIGH (ref 4.0–10.5)
nRBC: 0 % (ref 0.0–0.2)

## 2019-08-16 LAB — GLUCOSE, CAPILLARY: Glucose-Capillary: 178 mg/dL — ABNORMAL HIGH (ref 70–99)

## 2019-08-16 LAB — HCG, SERUM, QUALITATIVE: Preg, Serum: NEGATIVE

## 2019-08-17 ENCOUNTER — Other Ambulatory Visit (HOSPITAL_COMMUNITY)
Admission: RE | Admit: 2019-08-17 | Discharge: 2019-08-17 | Disposition: A | Discharge status: Home / Self Care | Payer: Medicaid Other | Source: Ambulatory Visit | Attending: General Surgery | Admitting: General Surgery

## 2019-08-17 DIAGNOSIS — Z20822 Contact with and (suspected) exposure to covid-19: Secondary | ICD-10-CM | POA: Insufficient documentation

## 2019-08-17 DIAGNOSIS — Z01812 Encounter for preprocedural laboratory examination: Secondary | ICD-10-CM | POA: Insufficient documentation

## 2019-08-17 LAB — HEMOGLOBIN A1C
Hgb A1c MFr Bld: 6.5 % — ABNORMAL HIGH (ref 4.8–5.6)
Mean Plasma Glucose: 140 mg/dL

## 2019-08-18 LAB — SARS CORONAVIRUS 2 (TAT 6-24 HRS): SARS Coronavirus 2: NEGATIVE

## 2019-08-20 ENCOUNTER — Ambulatory Visit (HOSPITAL_COMMUNITY): Payer: Self-pay | Admitting: Anesthesiology

## 2019-08-20 ENCOUNTER — Encounter (HOSPITAL_COMMUNITY)
Admission: RE | Disposition: A | Discharge status: Home / Self Care | Payer: Self-pay | Source: Home / Self Care | Attending: General Surgery

## 2019-08-20 ENCOUNTER — Ambulatory Visit (HOSPITAL_COMMUNITY)
Admission: RE | Admit: 2019-08-20 | Discharge: 2019-08-20 | Disposition: A | Discharge status: Home / Self Care | Payer: Self-pay | Attending: General Surgery | Admitting: General Surgery

## 2019-08-20 DIAGNOSIS — K8020 Calculus of gallbladder without cholecystitis without obstruction: Secondary | ICD-10-CM

## 2019-08-20 DIAGNOSIS — I10 Essential (primary) hypertension: Secondary | ICD-10-CM | POA: Insufficient documentation

## 2019-08-20 DIAGNOSIS — Z8249 Family history of ischemic heart disease and other diseases of the circulatory system: Secondary | ICD-10-CM | POA: Insufficient documentation

## 2019-08-20 DIAGNOSIS — K8010 Calculus of gallbladder with chronic cholecystitis without obstruction: Secondary | ICD-10-CM | POA: Insufficient documentation

## 2019-08-20 DIAGNOSIS — E119 Type 2 diabetes mellitus without complications: Secondary | ICD-10-CM | POA: Insufficient documentation

## 2019-08-20 DIAGNOSIS — Z880 Allergy status to penicillin: Secondary | ICD-10-CM | POA: Insufficient documentation

## 2019-08-20 DIAGNOSIS — Z818 Family history of other mental and behavioral disorders: Secondary | ICD-10-CM | POA: Insufficient documentation

## 2019-08-20 DIAGNOSIS — K219 Gastro-esophageal reflux disease without esophagitis: Secondary | ICD-10-CM | POA: Insufficient documentation

## 2019-08-20 DIAGNOSIS — F17210 Nicotine dependence, cigarettes, uncomplicated: Secondary | ICD-10-CM | POA: Insufficient documentation

## 2019-08-20 DIAGNOSIS — Z820 Family history of epilepsy and other diseases of the nervous system: Secondary | ICD-10-CM | POA: Insufficient documentation

## 2019-08-20 DIAGNOSIS — Z823 Family history of stroke: Secondary | ICD-10-CM | POA: Insufficient documentation

## 2019-08-20 DIAGNOSIS — Z825 Family history of asthma and other chronic lower respiratory diseases: Secondary | ICD-10-CM | POA: Insufficient documentation

## 2019-08-20 DIAGNOSIS — E282 Polycystic ovarian syndrome: Secondary | ICD-10-CM | POA: Insufficient documentation

## 2019-08-20 DIAGNOSIS — Z7984 Long term (current) use of oral hypoglycemic drugs: Secondary | ICD-10-CM | POA: Insufficient documentation

## 2019-08-20 DIAGNOSIS — Z79899 Other long term (current) drug therapy: Secondary | ICD-10-CM | POA: Insufficient documentation

## 2019-08-20 HISTORY — PX: CHOLECYSTECTOMY: SHX55

## 2019-08-20 LAB — GLUCOSE, CAPILLARY
Glucose-Capillary: 155 mg/dL — ABNORMAL HIGH (ref 70–99)
Glucose-Capillary: 177 mg/dL — ABNORMAL HIGH (ref 70–99)

## 2019-08-20 SURGERY — LAPAROSCOPIC CHOLECYSTECTOMY
Anesthesia: General

## 2019-08-20 MED ORDER — SCOPOLAMINE 1 MG/3DAYS TD PT72
1.0000 | MEDICATED_PATCH | Freq: Once | TRANSDERMAL | Status: DC
  Administered 2019-08-20: 1.5 mg via TRANSDERMAL
  Filled 2019-08-20: qty 1

## 2019-08-20 MED ORDER — DEXAMETHASONE SODIUM PHOSPHATE 10 MG/ML IJ SOLN
INTRAMUSCULAR | Status: DC | PRN
  Administered 2019-08-20: 8 mg via INTRAVENOUS

## 2019-08-20 MED ORDER — DEXAMETHASONE SODIUM PHOSPHATE 10 MG/ML IJ SOLN
INTRAMUSCULAR | Status: AC
  Filled 2019-08-20: qty 1

## 2019-08-20 MED ORDER — MEPERIDINE HCL 50 MG/ML IJ SOLN: 6.2500 mg | INTRAMUSCULAR | Status: DC | PRN

## 2019-08-20 MED ORDER — SUCCINYLCHOLINE CHLORIDE 20 MG/ML IJ SOLN
INTRAMUSCULAR | Status: DC | PRN
  Administered 2019-08-20: 120 mg via INTRAVENOUS

## 2019-08-20 MED ORDER — HEMOSTATIC AGENTS (NO CHARGE) OPTIME
TOPICAL | Status: DC | PRN
  Administered 2019-08-20: 1 via TOPICAL

## 2019-08-20 MED ORDER — FENTANYL CITRATE (PF) 100 MCG/2ML IJ SOLN
INTRAMUSCULAR | Status: DC | PRN
  Administered 2019-08-20: 100 ug via INTRAVENOUS
  Administered 2019-08-20: 50 ug via INTRAVENOUS
  Administered 2019-08-20: 100 ug via INTRAVENOUS

## 2019-08-20 MED ORDER — SUCCINYLCHOLINE CHLORIDE 200 MG/10ML IV SOSY
PREFILLED_SYRINGE | INTRAVENOUS | Status: AC
  Filled 2019-08-20: qty 10

## 2019-08-20 MED ORDER — LACTATED RINGERS IV SOLN: INTRAVENOUS | Status: DC | PRN

## 2019-08-20 MED ORDER — ROCURONIUM BROMIDE 10 MG/ML (PF) SYRINGE
PREFILLED_SYRINGE | INTRAVENOUS | Status: AC
  Filled 2019-08-20: qty 10

## 2019-08-20 MED ORDER — SODIUM CHLORIDE 0.9 % IR SOLN
Status: DC | PRN
  Administered 2019-08-20: 1000 mL

## 2019-08-20 MED ORDER — PROMETHAZINE HCL 25 MG/ML IJ SOLN: 6.2500 mg | INTRAMUSCULAR | Status: DC | PRN

## 2019-08-20 MED ORDER — ONDANSETRON HCL 4 MG/2ML IJ SOLN
INTRAMUSCULAR | Status: AC
  Filled 2019-08-20: qty 2

## 2019-08-20 MED ORDER — MIDAZOLAM HCL 5 MG/5ML IJ SOLN
INTRAMUSCULAR | Status: DC | PRN
  Administered 2019-08-20: 2 mg via INTRAVENOUS

## 2019-08-20 MED ORDER — SUGAMMADEX SODIUM 500 MG/5ML IV SOLN
INTRAVENOUS | Status: DC | PRN
  Administered 2019-08-20: 500 mg via INTRAVENOUS

## 2019-08-20 MED ORDER — LIDOCAINE 2% (20 MG/ML) 5 ML SYRINGE
INTRAMUSCULAR | Status: AC
  Filled 2019-08-20: qty 5

## 2019-08-20 MED ORDER — CIPROFLOXACIN IN D5W 400 MG/200ML IV SOLN
400.0000 mg | INTRAVENOUS | Status: AC
  Administered 2019-08-20: 400 mg via INTRAVENOUS
  Filled 2019-08-20: qty 200

## 2019-08-20 MED ORDER — BUPIVACAINE LIPOSOME 1.3 % IJ SUSP
INTRAMUSCULAR | Status: AC
  Filled 2019-08-20: qty 20

## 2019-08-20 MED ORDER — PROPOFOL 10 MG/ML IV BOLUS
INTRAVENOUS | Status: AC
  Filled 2019-08-20: qty 20

## 2019-08-20 MED ORDER — PROPOFOL 10 MG/ML IV BOLUS
INTRAVENOUS | Status: DC | PRN
  Administered 2019-08-20: 200 mg via INTRAVENOUS

## 2019-08-20 MED ORDER — ONDANSETRON HCL 4 MG/2ML IJ SOLN
INTRAMUSCULAR | Status: DC | PRN
  Administered 2019-08-20: 4 mg via INTRAVENOUS

## 2019-08-20 MED ORDER — KETOROLAC TROMETHAMINE 30 MG/ML IJ SOLN
INTRAMUSCULAR | Status: DC | PRN
  Administered 2019-08-20: 30 mg via INTRAVENOUS

## 2019-08-20 MED ORDER — BUPIVACAINE LIPOSOME 1.3 % IJ SUSP
INTRAMUSCULAR | Status: DC | PRN
  Administered 2019-08-20: 20 mL

## 2019-08-20 MED ORDER — ROCURONIUM BROMIDE 10 MG/ML (PF) SYRINGE
PREFILLED_SYRINGE | INTRAVENOUS | Status: DC | PRN
  Administered 2019-08-20: 50 mg via INTRAVENOUS

## 2019-08-20 MED ORDER — MIDAZOLAM HCL 2 MG/2ML IJ SOLN
INTRAMUSCULAR | Status: AC
  Filled 2019-08-20: qty 2

## 2019-08-20 MED ORDER — CHLORHEXIDINE GLUCONATE CLOTH 2 % EX PADS: 6.0000 | MEDICATED_PAD | Freq: Once | CUTANEOUS | Status: DC

## 2019-08-20 MED ORDER — HYDROMORPHONE HCL 1 MG/ML IJ SOLN
0.2500 mg | INTRAMUSCULAR | Status: DC | PRN
  Administered 2019-08-20: 0.5 mg via INTRAVENOUS
  Filled 2019-08-20: qty 0.5

## 2019-08-20 MED ORDER — FENTANYL CITRATE (PF) 250 MCG/5ML IJ SOLN
INTRAMUSCULAR | Status: AC
  Filled 2019-08-20: qty 5

## 2019-08-20 MED ORDER — LACTATED RINGERS IV SOLN: Freq: Once | INTRAVENOUS | Status: AC

## 2019-08-20 SURGICAL SUPPLY — 52 items
ADH SKN CLS APL DERMABOND .7 (GAUZE/BANDAGES/DRESSINGS) ×1
APL PRP STRL LF DISP 70% ISPRP (MISCELLANEOUS) ×1
APL SRG 38 LTWT LNG FL B (MISCELLANEOUS)
APPLICATOR ARISTA FLEXITIP XL (MISCELLANEOUS) IMPLANT
APPLIER CLIP ROT 10 11.4 M/L (STAPLE) ×3
APR CLP MED LRG 11.4X10 (STAPLE) ×1
BAG RETRIEVAL 10 (BASKET) ×1
BAG RETRIEVAL 10MM (BASKET) ×1
CHLORAPREP W/TINT 26 (MISCELLANEOUS) ×3 IMPLANT
CLIP APPLIE ROT 10 11.4 M/L (STAPLE) ×1 IMPLANT
CLOTH BEACON ORANGE TIMEOUT ST (SAFETY) ×3 IMPLANT
COVER LIGHT HANDLE STERIS (MISCELLANEOUS) ×6 IMPLANT
COVER WAND RF STERILE (DRAPES) ×3 IMPLANT
DERMABOND ADVANCED (GAUZE/BANDAGES/DRESSINGS) ×2
DERMABOND ADVANCED .7 DNX12 (GAUZE/BANDAGES/DRESSINGS) ×1 IMPLANT
ELECT REM PT RETURN 9FT ADLT (ELECTROSURGICAL) ×3
ELECTRODE REM PT RTRN 9FT ADLT (ELECTROSURGICAL) ×1 IMPLANT
GLOVE BIOGEL PI IND STRL 7.0 (GLOVE) ×2 IMPLANT
GLOVE BIOGEL PI IND STRL 7.5 (GLOVE) IMPLANT
GLOVE BIOGEL PI INDICATOR 7.0 (GLOVE) ×8
GLOVE BIOGEL PI INDICATOR 7.5 (GLOVE) ×2
GLOVE ECLIPSE 6.5 STRL STRAW (GLOVE) ×2 IMPLANT
GLOVE ECLIPSE 7.0 STRL STRAW (GLOVE) ×2 IMPLANT
GLOVE SURG SS PI 7.5 STRL IVOR (GLOVE) ×3 IMPLANT
GOWN STRL REUS W/TWL LRG LVL3 (GOWN DISPOSABLE) ×9 IMPLANT
HEMOSTAT ARISTA ABSORB 3G PWDR (HEMOSTASIS) IMPLANT
HEMOSTAT SNOW SURGICEL 2X4 (HEMOSTASIS) ×3 IMPLANT
INST SET LAPROSCOPIC AP (KITS) ×3 IMPLANT
KIT TURNOVER KIT A (KITS) ×3 IMPLANT
MANIFOLD NEPTUNE II (INSTRUMENTS) ×3 IMPLANT
NDL HYPO 18GX1.5 BLUNT FILL (NEEDLE) ×1 IMPLANT
NDL INSUFFLATION 14GA 120MM (NEEDLE) ×1 IMPLANT
NEEDLE HYPO 18GX1.5 BLUNT FILL (NEEDLE) ×3 IMPLANT
NEEDLE HYPO 22GX1.5 SAFETY (NEEDLE) ×3 IMPLANT
NEEDLE INSUFFLATION 14GA 120MM (NEEDLE) ×3 IMPLANT
NS IRRIG 1000ML POUR BTL (IV SOLUTION) ×3 IMPLANT
PACK LAP CHOLE LZT030E (CUSTOM PROCEDURE TRAY) ×3 IMPLANT
PAD ARMBOARD 7.5X6 YLW CONV (MISCELLANEOUS) ×3 IMPLANT
SET BASIN LINEN APH (SET/KITS/TRAYS/PACK) ×3 IMPLANT
SET TUBE SMOKE EVAC HIGH FLOW (TUBING) ×3 IMPLANT
SLEEVE ENDOPATH XCEL 5M (ENDOMECHANICALS) ×3 IMPLANT
SUT MNCRL AB 4-0 PS2 18 (SUTURE) ×6 IMPLANT
SUT VICRYL 0 UR6 27IN ABS (SUTURE) ×3 IMPLANT
SYR 20ML LL LF (SYRINGE) ×6 IMPLANT
SYS BAG RETRIEVAL 10MM (BASKET) ×1
SYSTEM BAG RETRIEVAL 10MM (BASKET) ×1 IMPLANT
TROCAR ENDO BLADELESS 11MM (ENDOMECHANICALS) ×3 IMPLANT
TROCAR XCEL NON-BLD 5MMX100MML (ENDOMECHANICALS) ×3 IMPLANT
TROCAR XCEL UNIV SLVE 11M 100M (ENDOMECHANICALS) ×3 IMPLANT
TUBE CONNECTING 12'X1/4 (SUCTIONS) ×1
TUBE CONNECTING 12X1/4 (SUCTIONS) ×2 IMPLANT
WARMER LAPAROSCOPE (MISCELLANEOUS) ×3 IMPLANT

## 2019-08-20 NOTE — Anesthesia Postprocedure Evaluation (Signed)
Anesthesia Post Note  Patient: Jennifer Cordova  Procedure(s) Performed: LAPAROSCOPIC CHOLECYSTECTOMY (N/A )  Patient location during evaluation: PACU Anesthesia Type: General Level of consciousness: awake, oriented, awake and alert and patient cooperative Pain management: pain level controlled Respiratory status: spontaneous breathing, respiratory function stable and nonlabored ventilation Cardiovascular status: stable Postop Assessment: no apparent nausea or vomiting Anesthetic complications: no     Last Vitals:  Vitals:   08/20/19 0651  BP: 138/81  Pulse: 96  Resp: 16  Temp: 37.1 C  SpO2: 98%    Last Pain:  Vitals:   08/20/19 0651  TempSrc: Oral  PainSc: 0-No pain                 Kamilah Correia

## 2019-08-20 NOTE — Op Note (Signed)
Patient:  Jennifer Cordova  DOB:  1981-04-18  MRN:  [id number]   Preop Diagnosis: Biliary colic, cholelithiasis  Postop Diagnosis: Same  Procedure: Laparoscopic cholecystectomy  Surgeon: Franky Macho, MD  Anes: General endotracheal  Indications: Patient is a 39 year old white female who presents with biliary colic secondary to cholelithiasis.  The risks and benefits of the procedure including bleeding, infection, hepatobiliary injury, and the possibility of an open procedure were fully explained to the patient, who gave informed consent.  Procedure note: The patient was placed in the supine position.  After induction of general endotracheal anesthesia, the abdomen was prepped and draped using the usual sterile technique with ChloraPrep.  Surgical site confirmation was performed.  A supraumbilical incision was made down to the fascia.  A Veress needle was introduced into the abdominal cavity and confirmation of placement was done using the saline drop test.  The abdomen was then insufflated to 15 mmHg pressure.  An 11 oh meter trocar was introduced into the abdominal cavity under direct visualization without difficulty.  The patient was placed in reverse Trendelenburg position and an additional 1 mm trocar was placed in the epigastric region and 5 mm trochars were placed the right upper quadrant and right flank regions.  Liver was inspected and noted to be within normal limits.  The gallbladder was retracted in a dynamic fashion in order to provide a critical view of the triangle of [place].  The cystic duct was first identified.  Its juncture to the infundibulum was fully identified.  Endoclips were placed proximally and distally on the cystic duct, and the cystic duct was divided.  This was likewise done to the cystic artery.  The gallbladder was freed away from the gallbladder fossa using Bovie electrocautery.  The gallbladder was delivered through the epigastric trocar site using an Endo  Catch bag.  The gallbladder fossa was inspected and no abnormal bleeding or bile leakage was noted.  Surgicel was placed in the gallbladder fossa.  All fluid and air were then evacuated from the abdominal cavity prior to removal of the trochars.  All wounds were irrigated with normal saline.  All wounds were injected with Exparel.  The incisions were closed using a 4-0 Monocryl subcuticular suture.  Dermabond was applied.  All tape and needle counts were correct at the end of the procedure.  The patient was extubated in the operating room and transferred to PACU in stable condition.  Complications: None  EBL: Minimal  Specimen: Gallbladder

## 2019-08-20 NOTE — Anesthesia Preprocedure Evaluation (Signed)
Anesthesia Evaluation  Patient identified by MRN, date of birth, ID band Patient awake    Reviewed: Allergy & Precautions, NPO status , Patient's Chart, lab work & pertinent test results  History of Anesthesia Complications Negative for: history of anesthetic complications  Airway Mallampati: II  TM Distance: >3 FB Neck ROM: Full    Dental  (+) Missing, Dental Advisory Given   Pulmonary Current Smoker and Patient abstained from smoking.,    Pulmonary exam normal breath sounds clear to auscultation       Cardiovascular Exercise Tolerance: Good hypertension, Pt. on medications Normal cardiovascular exam Rhythm:Regular Rate:Normal     Neuro/Psych negative neurological ROS  negative psych ROS   GI/Hepatic GERD  Medicated and Controlled,  Endo/Other  diabetes, Well Controlled, Type 2, Oral Hypoglycemic Agents  Renal/GU      Musculoskeletal  (+) Arthritis ,   Abdominal   Peds  Hematology   Anesthesia Other Findings   Reproductive/Obstetrics                            Anesthesia Physical Anesthesia Plan  ASA: II  Anesthesia Plan: General   Post-op Pain Management:    Induction: Intravenous  PONV Risk Score and Plan: 4 or greater and Ondansetron, Dexamethasone, Midazolam and Scopolamine patch - Pre-op  Airway Management Planned: Oral ETT  Additional Equipment:   Intra-op Plan:   Post-operative Plan:   Informed Consent: I have reviewed the patients History and Physical, chart, labs and discussed the procedure including the risks, benefits and alternatives for the proposed anesthesia with the patient or authorized representative who has indicated his/her understanding and acceptance.     Dental advisory given  Plan Discussed with: CRNA and Surgeon  Anesthesia Plan Comments:         Anesthesia Quick Evaluation

## 2019-08-20 NOTE — Transfer of Care (Signed)
Immediate Anesthesia Transfer of Care Note  Patient: Jennifer Cordova  Procedure(s) Performed: LAPAROSCOPIC CHOLECYSTECTOMY (N/A )  Patient Location: PACU  Anesthesia Type:General  Level of Consciousness: awake, alert  and oriented  Airway & Oxygen Therapy: Patient Spontanous Breathing  Post-op Assessment: Report given to RN, Post -op Vital signs reviewed and stable and Patient moving all extremities X 4  Post vital signs: Reviewed and stable  Last Vitals:  Vitals Value Taken Time  BP    Temp    Pulse 99 08/20/19 0815  Resp    SpO2 93 % 08/20/19 0815  Vitals shown include unvalidated device data.  Last Pain:  Vitals:   08/20/19 0651  TempSrc: Oral  PainSc: 0-No pain      Patients Stated Pain Goal: 8 (08/20/19 3335)  Complications: No apparent anesthesia complications

## 2019-08-20 NOTE — Interval H&P Note (Signed)
History and Physical Interval Note:  08/20/2019 7:18 AM  Jennifer Cordova  has presented today for surgery, with the diagnosis of cHOLELITHIASIS.  The various methods of treatment have been discussed with the patient and family. After consideration of risks, benefits and other options for treatment, the patient has consented to  Procedure(s) with comments: LAPAROSCOPIC CHOLECYSTECTOMY (N/A) - pt knows to arrive at 6:15 as a surgical intervention.  The patient's history has been reviewed, patient examined, no change in status, stable for surgery.  I have reviewed the patient's chart and labs.  Questions were answered to the patient's satisfaction.     Franky Macho

## 2019-08-20 NOTE — Anesthesia Procedure Notes (Signed)
Procedure Name: Intubation Date/Time: 08/20/2019 7:36 AM Performed by: Jonna Munro, CRNA Pre-anesthesia Checklist: Patient identified, Emergency Drugs available, Suction available, Patient being monitored and Timeout performed Patient Re-evaluated:Patient Re-evaluated prior to induction Oxygen Delivery Method: Circle system utilized Preoxygenation: Pre-oxygenation with 100% oxygen Induction Type: IV induction Laryngoscope Size: Mac and 3 Grade View: Grade I Tube type: Oral Tube size: 7.0 mm Number of attempts: 1 Placement Confirmation: ETT inserted through vocal cords under direct vision,  positive ETCO2 and breath sounds checked- equal and bilateral Secured at: 22 cm Tube secured with: Tape Dental Injury: Teeth and Oropharynx as per pre-operative assessment

## 2019-08-20 NOTE — Discharge Instructions (Signed)
Laparoscopic Cholecystectomy, Care After This sheet gives you information about how to care for yourself after your procedure. Your health care provider may also give you more specific instructions. If you have problems or questions, contact your health care provider. What can I expect after the procedure? After the procedure, it is common to have:  Pain at your incision sites. You will be given medicines to control this pain.  Mild nausea or vomiting.  Bloating and possible shoulder pain from the air-like gas that was used during the procedure. Follow these instructions at home: Incision care   Follow instructions from your health care provider about how to take care of your incisions. Make sure you: ? Wash your hands with soap and water before you change your bandage (dressing). If soap and water are not available, use hand sanitizer. ? Change your dressing as told by your health care provider. ? Leave stitches (sutures), skin glue, or adhesive strips in place. These skin closures may need to be in place for 2 weeks or longer. If adhesive strip edges start to loosen and curl up, you may trim the loose edges. Do not remove adhesive strips completely unless your health care provider tells you to do that.  Do not take baths, swim, or use a hot tub until your health care provider approves. Ask your health care provider if you can take showers. You may only be allowed to take sponge baths for bathing.  Check your incision area every day for signs of infection. Check for: ? More redness, swelling, or pain. ? More fluid or blood. ? Warmth. ? Pus or a bad smell. Activity  Do not drive or use heavy machinery while taking prescription pain medicine.  Do not lift anything that is heavier than 10 lb (4.5 kg) until your health care provider approves.  Do not play contact sports until your health care provider approves.  Do not drive for 24 hours if you were given a medicine to help you relax  (sedative).  Rest as needed. Do not return to work or school until your health care provider approves. General instructions  Take over-the-counter and prescription medicines only as told by your health care provider.  To prevent or treat constipation while you are taking prescription pain medicine, your health care provider may recommend that you: ? Drink enough fluid to keep your urine clear or pale yellow. ? Take over-the-counter or prescription medicines. ? Eat foods that are high in fiber, such as fresh fruits and vegetables, whole grains, and beans. ? Limit foods that are high in fat and processed sugars, such as fried and sweet foods. Contact a health care provider if:  You develop a rash.  You have more redness, swelling, or pain around your incisions.  You have more fluid or blood coming from your incisions.  Your incisions feel warm to the touch.  You have pus or a bad smell coming from your incisions.  You have a fever.  One or more of your incisions breaks open. Get help right away if:  You have trouble breathing.  You have chest pain.  You have increasing pain in your shoulders.  You faint or feel dizzy when you stand.  You have severe pain in your abdomen.  You have nausea or vomiting that lasts for more than one day.  You have leg pain. This information is not intended to replace advice given to you by your health care provider. Make sure you discuss any questions you have   with your health care provider. Document Revised: 04/08/2017 Document Reviewed: 10/13/2015 Elsevier Patient Education  2020 [place] REMOVE Friday April 16th Remove patch behind right ear in 72 hours, wash hands after removal  General Anesthesia, Adult, Care After This sheet gives you information about how to care for yourself after your procedure. Your health care provider may also give you more specific instructions. If you have problems or  questions, contact your health care provider. What can I expect after the procedure? After the procedure, the following side effects are common:  Pain or discomfort at the IV site.  Nausea.  Vomiting.  Sore throat.  Trouble concentrating.  Feeling cold or chills.  Weak or tired.  Sleepiness and fatigue.  Soreness and body aches. These side effects can affect parts of the body that were not involved in surgery. Follow these instructions at home:  For at least 24 hours after the procedure:  Have a responsible adult stay with you. It is important to have someone help care for you until you are awake and alert.  Rest as needed.  Do not: ? Participate in activities in which you could fall or become injured. ? Drive. ? Use heavy machinery. ? Drink alcohol. ? Take sleeping pills or medicines that cause drowsiness. ? Make important decisions or sign legal documents. ? Take care of children on your own. Eating and drinking  Follow any instructions from your health care provider about eating or drinking restrictions.  When you feel hungry, start by eating small amounts of foods that are soft and easy to digest (bland), such as toast. Gradually return to your regular diet.  Drink enough fluid to keep your urine pale yellow.  If you vomit, rehydrate by drinking water, juice, or clear broth. General instructions  If you have sleep apnea, surgery and certain medicines can increase your risk for breathing problems. Follow instructions from your health care provider about wearing your sleep device: ? Anytime you are sleeping, including during daytime naps. ? While taking prescription pain medicines, sleeping medicines, or medicines that make you drowsy.  Return to your normal activities as told by your health care provider. Ask your health care provider what activities are safe for you.  Take over-the-counter and prescription medicines only as told by your health care  provider.  If you smoke, do not smoke without supervision.  Keep all follow-up visits as told by your health care provider. This is important. Contact a health care provider if:  You have nausea or vomiting that does not get better with medicine.  You cannot eat or drink without vomiting.  You have pain that does not get better with medicine.  You are unable to pass urine.  You develop a skin rash.  You have a fever.  You have redness around your IV site that gets worse. Get help right away if:  You have difficulty breathing.  You have chest pain.  You have blood in your urine or stool, or you vomit blood. Summary  After the procedure, it is common to have a sore throat or nausea. It is also common to feel tired.  Have a responsible adult stay with you for the first 24 hours after general anesthesia. It is important to have someone help care for you until you are awake and alert.  When you feel hungry, start by eating small amounts of foods that are soft and easy to digest (bland), such as toast. Gradually return  to your regular diet.  Drink enough fluid to keep your urine pale yellow.  Return to your normal activities as told by your health care provider. Ask your health care provider what activities are safe for you. This information is not intended to replace advice given to you by your health care provider. Make sure you discuss any questions you have with your health care provider. Document Revised: 04/29/2017 Document Reviewed: 12/10/2016 Elsevier Patient Education  2020 [employer].

## 2019-08-21 LAB — SURGICAL PATHOLOGY

## 2021-01-16 IMAGING — US US ABDOMEN LIMITED
1 series · 14 of 25 positions shown · non-contrast
Comparison: None.

CLINICAL DATA: Right upper quadrant pain

EXAM:
ULTRASOUND ABDOMEN LIMITED RIGHT UPPER QUADRANT

[Series 1: us abdomen limited · 0.21mm/px · 14 of 48 slices shown]
[im 1/48]
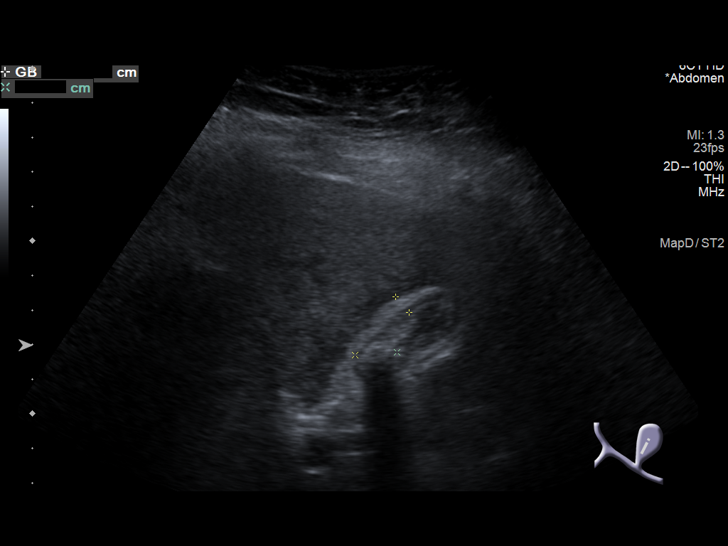
[im 4/48]
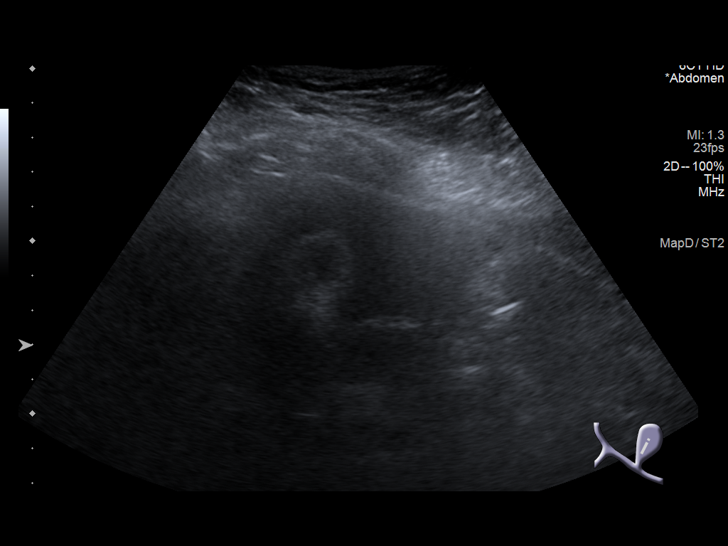
[im 8/48]
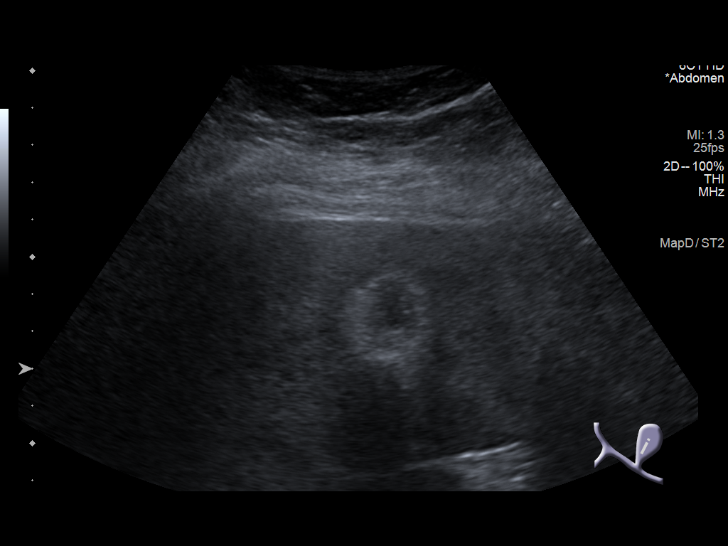
[im 12/48]
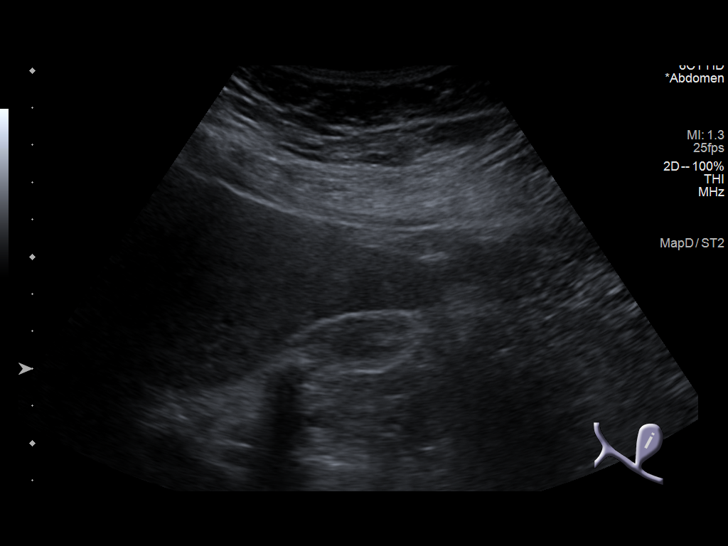
[im 16/48]
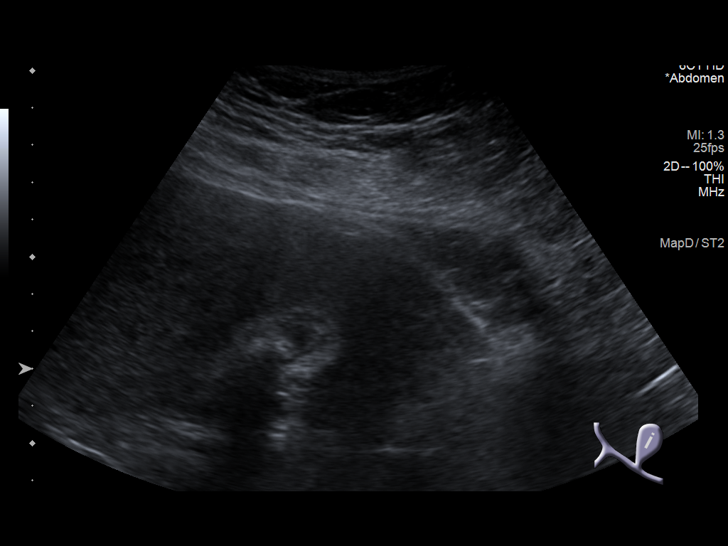
[im 18/48]
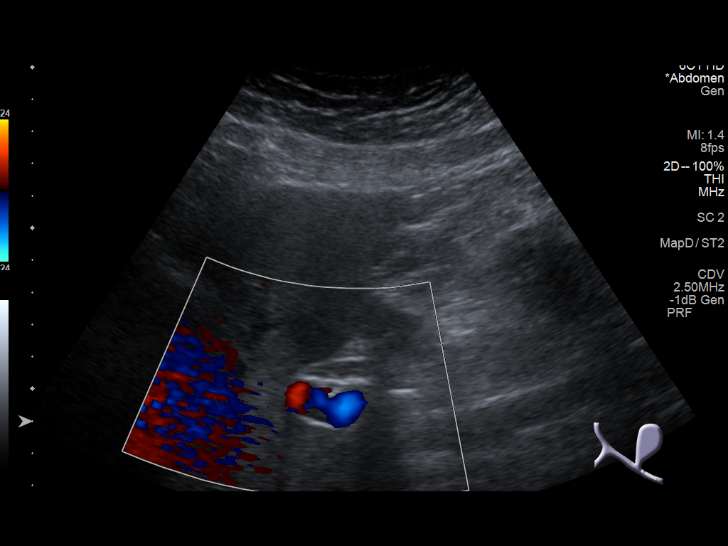
[im 22/48]
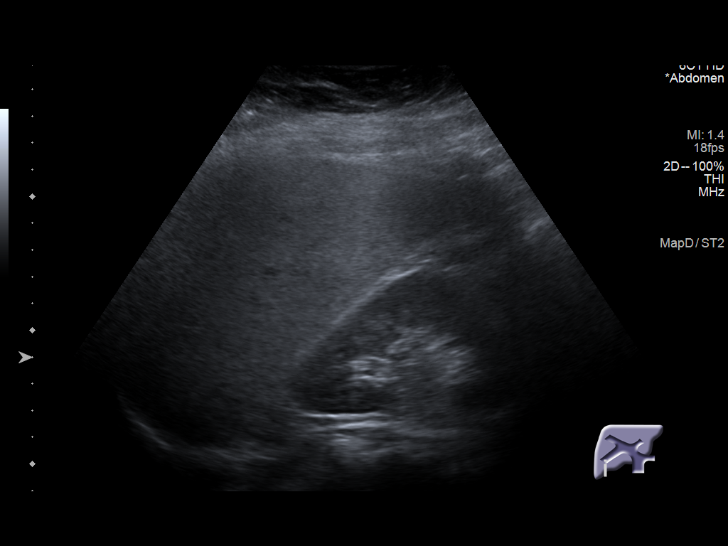
[im 26/48]
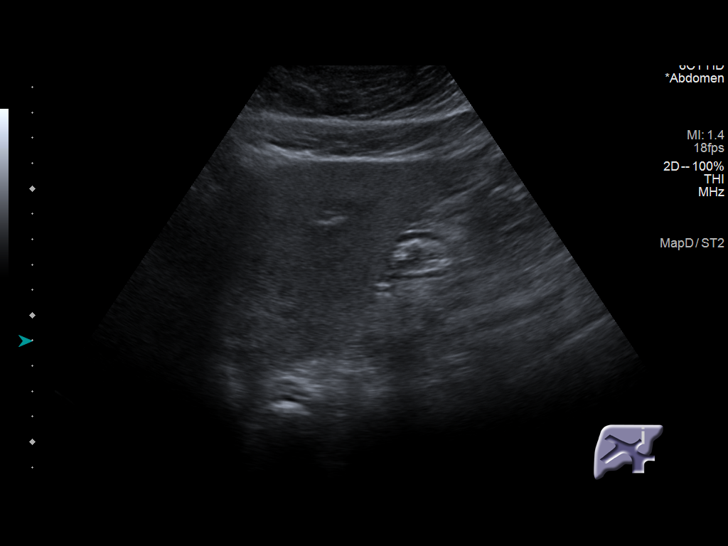
[im 30/48]
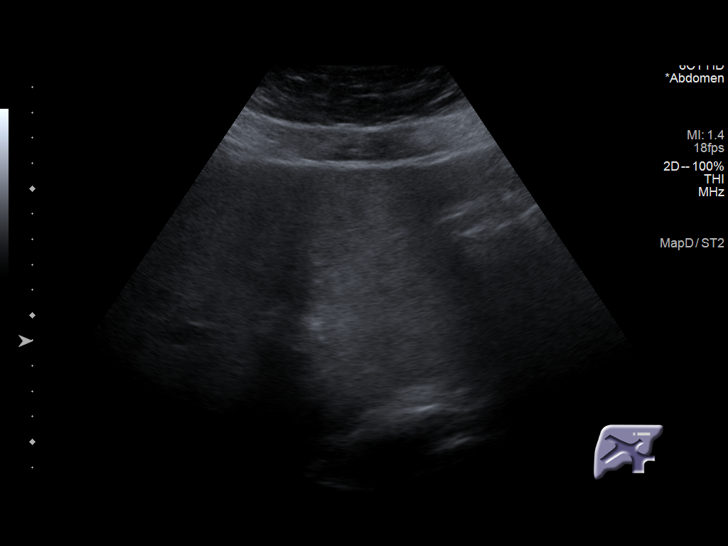
[im 32/48]
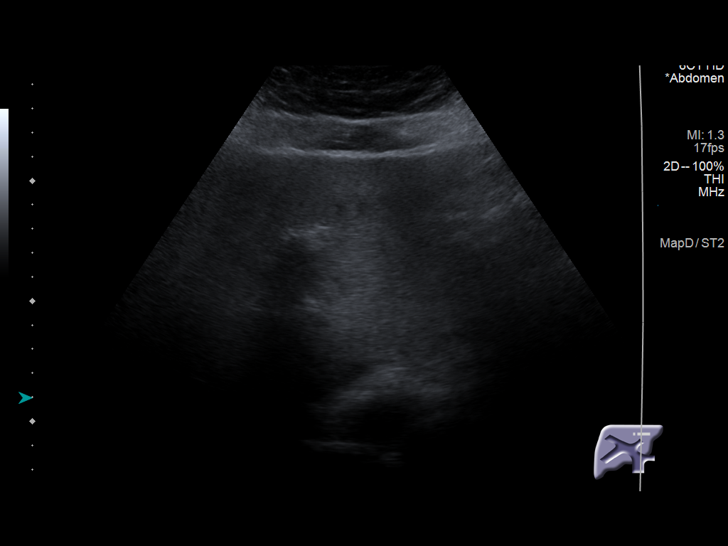
[im 36/48]
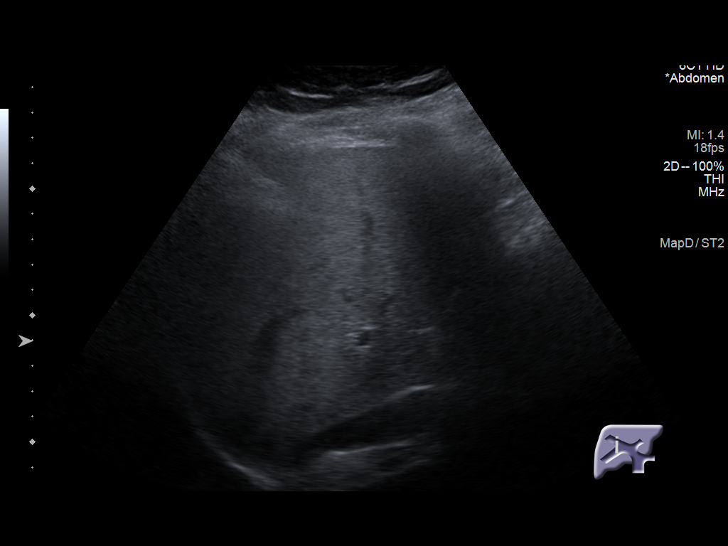
[im 40/48]
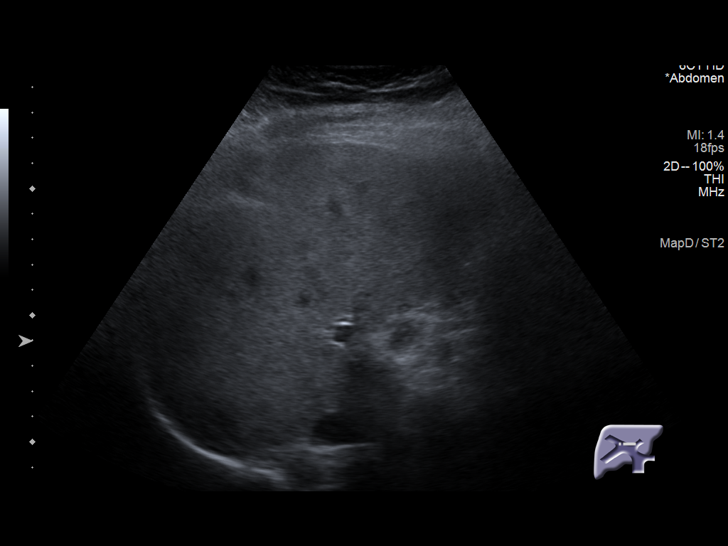
[im 44/48]
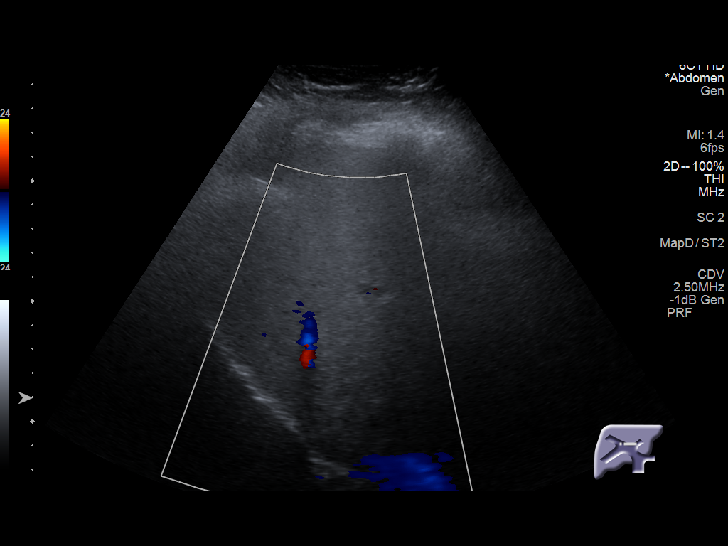
[im 48/48]
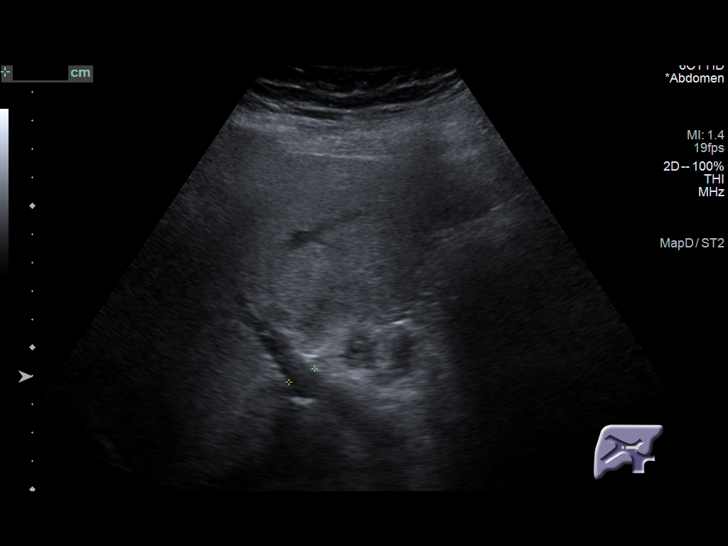

[14 of 25 positions shown; findings below may reference images not displayed]

FINDINGS: Gallbladder:

Contracted gallbladder with gallstones measuring up to 12 mm.
Gallbladder wall is echogenic and thickened to 6 mm. Negative
sonographic Murphy sign

Common bile duct:

Diameter: 7 mm

Liver:

Increased echogenicity liver diffusely without focal liver lesion.
Portal vein is patent on color Doppler imaging with normal direction
of blood flow towards the liver.

Other: Negative for ascites right upper quadrant
IMPRESSION: Gallstones with gallbladder wall thickening suggesting
cholecystitis. No biliary dilatation

Echogenic liver compatible with fatty infiltration.

## 2023-11-25 ENCOUNTER — Encounter: Payer: Self-pay | Admitting: Advanced Practice Midwife
